# Patient Record
Sex: Female | Born: 1995 | Race: Black or African American | Hispanic: No | Marital: Single | Smoking: Former smoker
Health system: Southern US, Community
[De-identification: ages and names within clinical notes are randomized; demographics above are authoritative.]

## PROBLEM LIST (undated history)

## (undated) ENCOUNTER — Inpatient Hospital Stay (HOSPITAL_COMMUNITY): Payer: Self-pay

## (undated) DIAGNOSIS — B999 Unspecified infectious disease: Secondary | ICD-10-CM

## (undated) DIAGNOSIS — J45909 Unspecified asthma, uncomplicated: Secondary | ICD-10-CM

## (undated) DIAGNOSIS — I10 Essential (primary) hypertension: Secondary | ICD-10-CM

## (undated) DIAGNOSIS — F419 Anxiety disorder, unspecified: Secondary | ICD-10-CM

## (undated) HISTORY — DX: Essential (primary) hypertension: I10

## (undated) HISTORY — PX: NO PAST SURGERIES: SHX2092

---

## 2016-12-07 LAB — OB RESULTS CONSOLE RPR: RPR: NONREACTIVE

## 2016-12-07 LAB — OB RESULTS CONSOLE GC/CHLAMYDIA
CHLAMYDIA, DNA PROBE: NEGATIVE
Gonorrhea: NEGATIVE

## 2016-12-07 LAB — CULTURE, OB URINE: Urine Culture, OB: <10000

## 2016-12-07 LAB — CYTOLOGY - PAP: PAP SMEAR: NEGATIVE

## 2016-12-07 LAB — HIV ANTIBODY (ROUTINE TESTING W REFLEX): HIV Screen 4th Generation wRfx: NONREACTIVE

## 2016-12-07 LAB — OB RESULTS CONSOLE HGB/HCT, BLOOD
HEMATOCRIT: 38
HEMOGLOBIN: 12.6

## 2016-12-07 LAB — OB RESULTS CONSOLE PLATELET COUNT: Platelets: 265

## 2016-12-07 LAB — OB RESULTS CONSOLE RUBELLA ANTIBODY, IGM: RUBELLA: IMMUNE

## 2016-12-07 LAB — OB RESULTS CONSOLE HEPATITIS B SURFACE ANTIGEN: Hepatitis B Surface Ag: NEGATIVE

## 2017-01-15 ENCOUNTER — Emergency Department (HOSPITAL_COMMUNITY)
Admission: EM | Admit: 2017-01-15 | Discharge: 2017-01-15 | Disposition: A | Discharge status: Home / Self Care | Payer: Medicaid Other | Attending: Emergency Medicine | Admitting: Emergency Medicine

## 2017-01-15 ENCOUNTER — Encounter (HOSPITAL_COMMUNITY): Payer: Self-pay | Admitting: Emergency Medicine

## 2017-01-15 DIAGNOSIS — J45909 Unspecified asthma, uncomplicated: Secondary | ICD-10-CM | POA: Insufficient documentation

## 2017-01-15 DIAGNOSIS — Z5321 Procedure and treatment not carried out due to patient leaving prior to being seen by health care provider: Secondary | ICD-10-CM | POA: Insufficient documentation

## 2017-01-15 HISTORY — DX: Unspecified asthma, uncomplicated: J45.909

## 2017-01-15 MED ORDER — ALBUTEROL SULFATE (2.5 MG/3ML) 0.083% IN NEBU
5.0000 mg | INHALATION_SOLUTION | Freq: Once | RESPIRATORY_TRACT | Status: AC
  Administered 2017-01-15: 5 mg via RESPIRATORY_TRACT
  Filled 2017-01-15: qty 6

## 2017-01-15 NOTE — ED Notes (Signed)
Called [facility] and made them aware of patient.

## 2017-01-15 NOTE — ED Triage Notes (Signed)
Pt comes in with complaints of an asthma flare up after being around someone smoking a cigarette this evening.  Pt reports moving here from [place] and does not have a primary care doctor or an inhaler.  Pt is 28 weeks and 4 days pregnant. Audible wheezing noted.  FHR 145.

## 2017-01-28 ENCOUNTER — Other Ambulatory Visit: Payer: Self-pay

## 2017-01-28 ENCOUNTER — Encounter (HOSPITAL_COMMUNITY): Payer: Self-pay | Admitting: *Deleted

## 2017-01-28 ENCOUNTER — Inpatient Hospital Stay (HOSPITAL_COMMUNITY)
Admission: AD | Admit: 2017-01-28 | Discharge: 2017-01-28 | Disposition: A | Discharge status: Home / Self Care | Payer: Medicaid Other | Source: Ambulatory Visit | Attending: Obstetrics and Gynecology | Admitting: Obstetrics and Gynecology

## 2017-01-28 DIAGNOSIS — O0933 Supervision of pregnancy with insufficient antenatal care, third trimester: Secondary | ICD-10-CM | POA: Diagnosis not present

## 2017-01-28 DIAGNOSIS — O99343 Other mental disorders complicating pregnancy, third trimester: Secondary | ICD-10-CM | POA: Insufficient documentation

## 2017-01-28 DIAGNOSIS — O99513 Diseases of the respiratory system complicating pregnancy, third trimester: Secondary | ICD-10-CM

## 2017-01-28 DIAGNOSIS — F419 Anxiety disorder, unspecified: Secondary | ICD-10-CM | POA: Insufficient documentation

## 2017-01-28 DIAGNOSIS — Z87891 Personal history of nicotine dependence: Secondary | ICD-10-CM | POA: Insufficient documentation

## 2017-01-28 DIAGNOSIS — R0602 Shortness of breath: Secondary | ICD-10-CM | POA: Diagnosis present

## 2017-01-28 DIAGNOSIS — J4521 Mild intermittent asthma with (acute) exacerbation: Secondary | ICD-10-CM | POA: Diagnosis not present

## 2017-01-28 DIAGNOSIS — Z3A29 29 weeks gestation of pregnancy: Secondary | ICD-10-CM | POA: Insufficient documentation

## 2017-01-28 DIAGNOSIS — R062 Wheezing: Secondary | ICD-10-CM | POA: Diagnosis present

## 2017-01-28 HISTORY — DX: Anxiety disorder, unspecified: F41.9

## 2017-01-28 HISTORY — DX: Unspecified infectious disease: B99.9

## 2017-01-28 MED ORDER — ALBUTEROL SULFATE HFA 108 (90 BASE) MCG/ACT IN AERS
2.0000 | INHALATION_SPRAY | Freq: Once | RESPIRATORY_TRACT | Status: AC
  Administered 2017-01-28: 2 via RESPIRATORY_TRACT
  Filled 2017-01-28: qty 6.7

## 2017-01-28 MED ORDER — PREDNISONE 20 MG PO TABS: 40.0000 mg | ORAL_TABLET | Freq: Two times a day (BID) | ORAL | 0 refills | Status: DC

## 2017-01-28 MED ORDER — IPRATROPIUM-ALBUTEROL 0.5-2.5 (3) MG/3ML IN SOLN
RESPIRATORY_TRACT | Status: AC
  Administered 2017-01-28: 3 mL
  Filled 2017-01-28: qty 3

## 2017-01-28 MED ORDER — ALBUTEROL SULFATE (2.5 MG/3ML) 0.083% IN NEBU: 2.5000 mg | INHALATION_SOLUTION | Freq: Once | RESPIRATORY_TRACT | Status: DC

## 2017-01-28 MED ORDER — ALBUTEROL SULFATE HFA 108 (90 BASE) MCG/ACT IN AERS: 2.0000 | INHALATION_SPRAY | Freq: Four times a day (QID) | RESPIRATORY_TRACT | 2 refills | Status: DC | PRN

## 2017-01-28 MED ORDER — PREDNISONE 20 MG PO TABS: 20.0000 mg | ORAL_TABLET | Freq: Two times a day (BID) | ORAL | 0 refills | Status: AC

## 2017-01-28 MED ORDER — IPRATROPIUM-ALBUTEROL 0.5-2.5 (3) MG/3ML IN SOLN: 3.0000 mL | Freq: Once | RESPIRATORY_TRACT | Status: AC

## 2017-01-28 NOTE — MAU Note (Signed)
Since this morning, has been wheezing,  Has been short of breath. Dx with asthma at age 21.  Does not have a rescue inhaler. Cough started during the night,non- productive. Denies sore throat or fever.  [place]

## 2017-01-28 NOTE — MAU Note (Signed)
Urine sent to lab 

## 2017-01-28 NOTE — MAU Provider Note (Signed)
Chief Complaint:  Shortness of Breath and Wheezing   None     HPI: Tonya Joseph is a 21 y.o. G1P0 at 6379w4d with limited prenatal care and hx of asthma who presents to maternity admissions reporting shortness of breath, wheezing, and cough starting today. She usually has an albuterol inhaler but she ran out recently. She denies any recent respiratory infection or other known trigger for her asthma but reports symptoms started today and worsens over the course of the day. She has a cough and wheezing associated with her shortness of breath. There is also sharp low back pain, especially when she coughs.  She has not tried any treatments.  There are no other associated symptoms.  [place] in [place] but moved to [place] 1 [place] ago and has not had any prenatal visits.   She reports good fetal movement, denies cramping/contractions, LOF, vaginal bleeding, vaginal itching/burning, urinary symptoms, h/a, dizziness, n/v, or fever/chills.    HPI  Past Medical History: Past Medical History:  Diagnosis Date  . Anxiety   . Asthma   . Infection    UTI    Past obstetric history: OB History  Gravida Para Term Preterm AB Living  2 1 1    0 1  SAB TAB Ectopic Multiple Live Births          1    # Outcome Date GA Lbr Len/2nd Weight Sex Delivery Anes PTL Lv  2 Current           1 Term     F Vag-Spont  N LIV      Past Surgical History: Past Surgical History:  Procedure Laterality Date  . NO PAST SURGERIES      Family History: Family History  Problem Relation Age of Onset  . Diabetes Maternal Grandmother   . Hypertension Paternal Grandmother     Social History: Social History   Tobacco Use  . Smoking status: Former Games developermoker  . Smokeless tobacco: Never Used  . Tobacco comment: quit afteer asthma dx  Substance Use Topics  . Alcohol use: No    Frequency: Never  . Drug use: No    Allergies: No Known Allergies  Meds:  No medications prior to admission.    ROS:   Review of Systems  Constitutional: Negative for chills, fatigue and fever.  Eyes: Negative for visual disturbance.  Respiratory: Positive for cough, chest tightness, shortness of breath and wheezing.   Cardiovascular: Negative for chest pain.  Gastrointestinal: Negative for abdominal pain, nausea and vomiting.  Genitourinary: Negative for difficulty urinating, dysuria, flank pain, pelvic pain, vaginal bleeding, vaginal discharge and vaginal pain.  Musculoskeletal: Positive for back pain.  Neurological: Negative for dizziness and headaches.  Psychiatric/Behavioral: Negative.      I have reviewed patient's Past Medical Hx, Surgical Hx, Family Hx, Social Hx, medications and allergies.   Physical Exam   Patient Vitals for the past 24 hrs:  BP Pulse Resp SpO2  01/28/17 1651 (!) 117/51 91 16 98 %  01/28/17 1514 122/66 77 18 100 %  01/28/17 1405 112/71 89 18 96 %  01/28/17 1330 - - - 100 %  01/28/17 1310 - - - 100 %   Constitutional: Well-developed, well-nourished female in moderate distress.  HEART: normal rate, heart sounds, regular rhythm RESP: labored breathing, inspiratory and expiratory wheezing bilaterally in all lobes GI: Abd soft, non-tender, gravid appropriate for gestational age.  MS: Extremities nontender, no edema, normal ROM Neurologic: Alert and oriented x 4.  GU: Neg CVAT.      FHT:  Baseline 145 , moderate variability, accelerations present, no decelerations Contractions:None on toco or to palpation   Labs: No results found for this or any previous visit (from the past 24 hour(s)).    Imaging:  No results found.  MAU Course/MDM: I have ordered labs and reviewed results.  NST reviewed and reactive Pt with wheezing throughout and cough, classic presentation of asthma exacerbation.  O2 sats 100% on RA Duoneb nebulizer breathing treatment given by respiratory staff Pt with significant improvement and wheezing resolved but cough persists so needs  corticosteroid therapy Pt without insurance so cannot afford inhaled steroids Consult Dr Emelda FearFerguson with presentation, exam findings and test results.  With severity of presentation and pt cost concerns, course of PO steroids x 5 days, renewed Rx for albuterol inhaler  [facility] to [facility] Pt discharge with strict return precautions.   Assessment: 1. Limited prenatal care in third trimester   2. Mild intermittent asthma with exacerbation     Plan: Discharge home Labor precautions and fetal kick counts  Follow-up Information    [facility]   Specialty:  Obstetrics and Gynecology Why:  The office will call you for appointment. Return to MAU as needed for emergencies.  Contact information: [address] [place] [id number] [phone]         Allergies as of 01/28/2017   No Known Allergies     Medication List    TAKE these medications   albuterol 108 (90 Base) MCG/ACT inhaler Commonly known as:  PROVENTIL HFA;VENTOLIN HFA Inhale 2 puffs every 6 (six) hours as needed into the lungs for wheezing or shortness of breath. What changed:  how much to take   predniSONE 20 MG tablet Commonly known as:  DELTASONE Take 1 tablet (20 mg total) 2 (two) times daily with a meal for 5 days by mouth.   prenatal multivitamin Tabs tablet Take 1 tablet daily at 12 noon by mouth.       Sharen CounterLisa Leftwich-Kirby Certified Nurse-Midwife 01/28/2017 8:47 PM

## 2017-02-04 ENCOUNTER — Encounter (HOSPITAL_COMMUNITY): Payer: Self-pay | Admitting: *Deleted

## 2017-02-04 ENCOUNTER — Inpatient Hospital Stay (HOSPITAL_COMMUNITY)
Admission: AD | Admit: 2017-02-04 | Discharge: 2017-02-04 | Disposition: A | Discharge status: Home / Self Care | Payer: Medicaid Other | Source: Ambulatory Visit | Attending: Obstetrics and Gynecology | Admitting: Obstetrics and Gynecology

## 2017-02-04 DIAGNOSIS — O9989 Other specified diseases and conditions complicating pregnancy, childbirth and the puerperium: Secondary | ICD-10-CM | POA: Diagnosis not present

## 2017-02-04 DIAGNOSIS — R109 Unspecified abdominal pain: Secondary | ICD-10-CM | POA: Insufficient documentation

## 2017-02-04 DIAGNOSIS — O26893 Other specified pregnancy related conditions, third trimester: Secondary | ICD-10-CM | POA: Insufficient documentation

## 2017-02-04 DIAGNOSIS — Z3689 Encounter for other specified antenatal screening: Secondary | ICD-10-CM

## 2017-02-04 DIAGNOSIS — W19XXXA Unspecified fall, initial encounter: Secondary | ICD-10-CM

## 2017-02-04 DIAGNOSIS — M545 Low back pain: Secondary | ICD-10-CM

## 2017-02-04 DIAGNOSIS — Z3A30 30 weeks gestation of pregnancy: Secondary | ICD-10-CM | POA: Insufficient documentation

## 2017-02-04 LAB — URINALYSIS, ROUTINE W REFLEX MICROSCOPIC
Bilirubin Urine: NEGATIVE
Glucose, UA: 50 mg/dL — AB
Hgb urine dipstick: NEGATIVE
KETONES UR: NEGATIVE mg/dL
LEUKOCYTES UA: NEGATIVE
NITRITE: NEGATIVE
PROTEIN: 30 mg/dL — AB
Specific Gravity, Urine: 1.024 (ref 1.005–1.030)
pH: 6 (ref 5.0–8.0)

## 2017-02-04 MED ORDER — CYCLOBENZAPRINE HCL 10 MG PO TABS: 10.0000 mg | ORAL_TABLET | Freq: Three times a day (TID) | ORAL | 0 refills | Status: DC | PRN

## 2017-02-04 MED ORDER — ACETAMINOPHEN 500 MG PO TABS
1000.0000 mg | ORAL_TABLET | Freq: Once | ORAL | Status: AC
  Administered 2017-02-04: 1000 mg via ORAL
  Filled 2017-02-04: qty 2

## 2017-02-04 MED ORDER — CYCLOBENZAPRINE HCL 10 MG PO TABS
10.0000 mg | ORAL_TABLET | Freq: Once | ORAL | Status: AC
  Administered 2017-02-04: 10 mg via ORAL
  Filled 2017-02-04: qty 1

## 2017-02-04 NOTE — MAU Note (Signed)
Pt reports she awakened this am with really bad heartburn and vomited. States when she vomited she felt something pull in her back and also fell into the tub and is now having constant mid/lower back pain and abd pain.

## 2017-02-04 NOTE — MAU Provider Note (Signed)
Chief Complaint:  Back Pain; Abdominal Pain; and Fall   First Provider Initiated Contact with Patient 02/04/17 1829      HPI: Tonya Joseph is a 21 y.o. G2P1001 at 6934w4d who presents to maternity admissions reporting back pain, abdominal pain and fall. She reports that she fell in the shower on her back over an hour ago- states she did not hit her stomach at any point. Reports that she felt something pull in her back when she was vomiting this morning that pulled a muscle in her back. Abdominal pain began after she fell and has been intermitting sharp pain on her ride side where she fell. She has not taken any medication for pain relief. She reports good fetal movement, denies LOF, vaginal bleeding, vaginal itching/burning, urinary symptoms, h/a, dizziness, n/v, or fever/chills.    Past Medical History: Past Medical History:  Diagnosis Date  . Anxiety   . Asthma   . Infection    UTI    Past obstetric history: OB History  Gravida Para Term Preterm AB Living  2 1 1    0 1  SAB TAB Ectopic Multiple Live Births          1    # Outcome Date GA Lbr Len/2nd Weight Sex Delivery Anes PTL Lv  2 Current           1 Term     F Vag-Spont  N LIV      Past Surgical History: Past Surgical History:  Procedure Laterality Date  . NO PAST SURGERIES      Family History: Family History  Problem Relation Age of Onset  . Diabetes Maternal Grandmother   . Hypertension Paternal Grandmother     Social History: Social History   Tobacco Use  . Smoking status: Former Games developermoker  . Smokeless tobacco: Never Used  . Tobacco comment: quit afteer asthma dx  Substance Use Topics  . Alcohol use: No    Frequency: Never  . Drug use: No    Allergies: No Known Allergies  Meds:  No medications prior to admission.    ROS:   Review of Systems  Constitutional: Negative for chills, diaphoresis, fatigue and fever.  Respiratory: Negative for cough, chest tightness, shortness of breath and wheezing.    Cardiovascular: Negative for chest pain and leg swelling.  Gastrointestinal: Positive for abdominal pain. Negative for constipation, diarrhea, nausea and vomiting.  Genitourinary: Negative for difficulty urinating, dysuria, flank pain, urgency, vaginal bleeding, vaginal discharge and vaginal pain.  Musculoskeletal: Positive for back pain. Negative for neck pain.  Neurological: Negative for dizziness, weakness, light-headedness and headaches.  All other systems reviewed and are negative.  I have reviewed patient's Past Medical Hx, Surgical Hx, Family Hx, Social Hx, medications and allergies.   Physical Exam   Patient Vitals for the past 24 hrs:  BP Temp Temp src Pulse Resp SpO2 Height Weight  02/04/17 2233 117/63 98.1 F (36.7 C) Oral 91 18 - - -  02/04/17 1807 (!) 101/55 98.6 F (37 C) Oral 88 20 99 % 5\' 6"  (1.676 m) 207 lb (93.9 kg)   Constitutional: Well-developed, well-nourished female in no acute distress.  Cardiovascular: normal rate Respiratory: normal effort GI: Abd soft, non-tender, gravid appropriate for gestational age.  MS: Extremities nontender, no edema, normal ROM Neurologic: Alert and oriented x 4.  GU: Neg CVAT.  Cervical exam: Dilation: Closed Effacement (%): Thick Station: Ballotable Exam by:: Fabian NovemberM Oluwasemilore Pascuzzi CNM  Dilation: Closed Effacement (%): Thick Station: Ballotable Exam by::  Fabian NovemberM Katena Petitjean CNM FHT:  Baseline 145, moderate variability, accelerations present, no decelerations Contractions: irregular irritability   Labs: Results for orders placed or performed during the hospital encounter of 02/04/17 (from the past 24 hour(s))  Urinalysis, Routine w reflex microscopic     Status: Abnormal   Collection Time: 02/04/17  6:08 PM  Result Value Ref Range   Color, Urine YELLOW YELLOW   APPearance HAZY (A) CLEAR   Specific Gravity, Urine 1.024 1.005 - 1.030   pH 6.0 5.0 - 8.0   Glucose, UA 50 (A) NEGATIVE mg/dL   Hgb urine dipstick NEGATIVE NEGATIVE   Bilirubin  Urine NEGATIVE NEGATIVE   Ketones, ur NEGATIVE NEGATIVE mg/dL   Protein, ur 30 (A) NEGATIVE mg/dL   Nitrite NEGATIVE NEGATIVE   Leukocytes, UA NEGATIVE NEGATIVE   RBC / HPF 0-5 0 - 5 RBC/hpf   WBC, UA 0-5 0 - 5 WBC/hpf   Bacteria, UA RARE (A) NONE SEEN   Squamous Epithelial / LPF 6-30 (A) NONE SEEN   Mucus PRESENT       MAU Course/MDM: I have ordered labs and reviewed results for Urinalysis  NST reviewed   Treatments in MAU included Flexeril 10mg  for muscle spasm. 1,000mg  Tylenol for abdominal pain. 4 hours of monitoring for fall.    Tonya Joseph Certified Nurse-Midwife 02/04/2017   2005: [facility] [place] [place] Prolonged EFM, Flexeril, heating pad  Abdominal pain resolved, back pain slightly improved. No evidence of abruption or PTL- cervix recheck closed/thick Stable for discharge home  Assessment: 1. [redacted] weeks gestation of pregnancy   2. NST (non-stress test) reactive   3. Fall, initial encounter    Plan: Discharge home Follow up in OB office this week as scheduled PTL/abruption precautions Rx Flexeril Tylenol prn Heat-tub soaks  Allergies as of 02/04/2017   No Known Allergies     Medication List    TAKE these medications   albuterol 108 (90 Base) MCG/ACT inhaler Commonly known as:  PROVENTIL HFA;VENTOLIN HFA Inhale 2 puffs every 6 (six) hours as needed into the lungs for wheezing or shortness of breath.   cyclobenzaprine 10 MG tablet Commonly known as:  FLEXERIL Take 1 tablet (10 mg total) by mouth every 8 (eight) hours as needed for muscle spasms.   prenatal multivitamin Tabs tablet Take 1 tablet daily at 12 noon by mouth.       Donette LarryBhambri, Malika Demario, CNM  02/05/2017 12:12 AM

## 2017-02-04 NOTE — Discharge Instructions (Signed)
Back Pain in Pregnancy Back pain during pregnancy is common. Back pain may be caused by several factors that are related to changes during your pregnancy. Follow these instructions at home: Managing pain, stiffness, and swelling  If directed, apply ice for sudden (acute) back pain. ? Put ice in a plastic bag. ? Place a towel between your skin and the bag. ? Leave the ice on for 20 minutes, 2-3 times per day.  If directed, apply heat to the affected area before you exercise: ? Place a towel between your skin and the heat pack or heating pad. ? Leave the heat on for 20-30 minutes. ? Remove the heat if your skin turns bright red. This is especially important if you are unable to feel pain, heat, or cold. You may have a greater risk of getting burned. Activity  Exercise as told by your health care provider. Exercising is the best way to prevent or manage back pain.  Listen to your body when lifting. If lifting hurts, ask for help or bend your knees. This uses your leg muscles instead of your back muscles.  Squat down when picking up something from the floor. Do not bend over.  Only use bed rest as told by your health care provider. Bed rest should only be used for the most severe episodes of back pain. Standing, Sitting, and Lying Down  Do not stand in one place for long periods of time.  Use good posture when sitting. Make sure your head rests over your shoulders and is not hanging forward. Use a pillow on your lower back if necessary.  Try sleeping on your side, preferably the left side, with a pillow or two between your legs. If you are sore after a night's rest, your bed may be too soft. A firm mattress may provide more support for your back during pregnancy. General instructions  Do not wear high heels.  Eat a healthy diet. Try to gain weight within your health care provider's recommendations.  Use a maternity girdle, elastic sling, or back brace as told by your health care  provider.  Take over-the-counter and prescription medicines only as told by your health care provider.  Keep all follow-up visits as told by your health care provider. This is important. This includes any visits with any specialists, such as a physical therapist. Contact a health care provider if:  Your back pain interferes with your daily activities.  You have increasing pain in other parts of your body. Get help right away if:  You develop numbness, tingling, weakness, or problems with the use of your arms or legs.  You develop severe back pain that is not controlled with medicine.  You have a sudden change in bowel or bladder control.  You develop shortness of breath, dizziness, or you faint.  You develop nausea, vomiting, or sweating.  You have back pain that is a rhythmic, cramping pain similar to labor pains. Labor pain is usually 1-2 minutes apart, lasts for about 1 minute, and involves a bearing down feeling or pressure in your pelvis.  You have back pain and your water breaks or you have vaginal bleeding.  You have back pain or numbness that travels down your leg.  Your back pain developed after you fell.  You develop pain on one side of your back.  You see blood in your urine.  You develop skin blisters in the area of your back pain. This information is not intended to replace advice given to you   by your health care provider. Make sure you discuss any questions you have with your health care provider. Document Released: 06/07/2005 Document Revised: 08/05/2015 Document Reviewed: 11/11/2014 Elsevier Interactive Patient Education  2018 [employer]xton Hicks Contractions Contractions of the uterus can occur throughout pregnancy, but they are not always a sign that you are in labor. You may have practice contractions called Braxton Hicks contractions. These false labor contractions are sometimes confused with true labor. What are Deberah PeltonBraxton Hicks  contractions? Braxton Hicks contractions are tightening movements that occur in the muscles of the uterus before labor. Unlike true labor contractions, these contractions do not result in opening (dilation) and thinning of the cervix. Toward the end of pregnancy (32-34 weeks), Braxton Hicks contractions can happen more often and may become stronger. These contractions are sometimes difficult to tell apart from true labor because they can be very uncomfortable. You should not feel embarrassed if you go to the hospital with false labor. Sometimes, the only way to tell if you are in true labor is for your health care provider to look for changes in the cervix. The health care provider will do a physical exam and may monitor your contractions. If you are not in true labor, the exam should show that your cervix is not dilating and your water has not broken. If there are no prenatal problems or other health problems associated with your pregnancy, it is completely safe for you to be sent home with false labor. You may continue to have Braxton Hicks contractions until you go into true labor. How can I tell the difference between true labor and false labor?  Differences ? False labor ? Contractions last 30-70 seconds.: Contractions are usually shorter and not as strong as true labor contractions. ? Contractions become very regular.: Contractions are usually irregular. ? Discomfort is usually felt in the top of the uterus, and it spreads to the lower abdomen and low back.: Contractions are often felt in the front of the lower abdomen and in the groin. ? Contractions do not go away with walking.: Contractions may go away when you walk around or change positions while lying down. ? Contractions usually become more intense and increase in frequency.: Contractions get weaker and are shorter-lasting as time goes on. ? The cervix dilates and gets thinner.: The cervix usually does not dilate or become thin. Follow  these instructions at home:  Take over-the-counter and prescription medicines only as told by your health care provider.  Keep up with your usual exercises and follow other instructions from your health care provider.  Eat and drink lightly if you think you are going into labor.  If Braxton Hicks contractions are making you uncomfortable: ? Change your position from lying down or resting to walking, or change from walking to resting. ? Sit and rest in a tub of warm water. ? Drink enough fluid to keep your urine clear or pale yellow. Dehydration may cause these contractions. ? Do slow and deep breathing several times an hour.  Keep all follow-up prenatal visits as told by your health care provider. This is important. Contact a health care provider if:  You have a fever.  You have continuous pain in your abdomen. Get help right away if:  Your contractions become stronger, more regular, and closer together.  You have fluid leaking or gushing from your vagina.  You pass blood-tinged mucus (bloody show).  You have bleeding from your vagina.  You have low back pain that you never  had before.  You feel your babys head pushing down and causing pelvic pressure.  Your baby is not moving inside you as much as it used to. Summary  Contractions that occur before labor are called Braxton Hicks contractions, false labor, or practice contractions.  Braxton Hicks contractions are usually shorter, weaker, farther apart, and less regular than true labor contractions. True labor contractions usually become progressively stronger and regular and they become more frequent.  Manage discomfort from [facility] contractions by changing position, resting in a warm bath, drinking plenty of water, or practicing deep breathing. This information is not intended to replace advice given to you by your health care provider. Make sure you discuss any questions you have with your health care  provider. Document Released: 02/27/2005 Document Revised: 01/17/2016 Document Reviewed: 01/17/2016 Elsevier Interactive Patient Education  2017 Elsevier Inc. Fetal Movement Counts Patient Name: ________________________________________________ Patient Due Date: ____________________ What is a fetal movement count? A fetal movement count is the number of times that you feel your baby move during a certain amount of time. This may also be called a fetal kick count. A fetal movement count is recommended for every pregnant woman. You may be asked to start counting fetal movements as early as week 28 of your pregnancy. Pay attention to when your baby is most active. You may notice your baby's sleep and wake cycles. You may also notice things that make your baby move more. You should do a fetal movement count:  When your baby is normally most active.  At the same time each day.  A good time to count movements is while you are resting, after having something to eat and drink. How do I count fetal movements? 1. Find a quiet, comfortable area. Sit, or lie down on your side. 2. Write down the date, the start time and stop time, and the number of movements that you felt between those two times. Take this information with you to your health care visits. 3. For 2 hours, count kicks, flutters, swishes, rolls, and jabs. You should feel at least 10 movements during 2 hours. 4. You may stop counting after you have felt 10 movements. 5. If you do not feel 10 movements in 2 hours, have something to eat and drink. Then, keep resting and counting for 1 hour. If you feel at least 4 movements during that hour, you may stop counting. Contact a health care provider if:  You feel fewer than 4 movements in 2 hours.  Your baby is not moving like he or she usually does. Date: ____________ Start time: ____________ Stop time: ____________ Movements: ____________ Date: ____________ Start time: ____________ Stop time:  ____________ Movements: ____________ Date: ____________ Start time: ____________ Stop time: ____________ Movements: ____________ Date: ____________ Start time: ____________ Stop time: ____________ Movements: ____________ Date: ____________ Start time: ____________ Stop time: ____________ Movements: ____________ Date: ____________ Start time: ____________ Stop time: ____________ Movements: ____________ Date: ____________ Start time: ____________ Stop time: ____________ Movements: ____________ Date: ____________ Start time: ____________ Stop time: ____________ Movements: ____________ Date: ____________ Start time: ____________ Stop time: ____________ Movements: ____________ This information is not intended to replace advice given to you by your health care provider. Make sure you discuss any questions you have with your health care provider. Document Released: 03/29/2006 Document Revised: 10/27/2015 Document Reviewed: 04/08/2015 Elsevier Interactive Patient Education  [employer]2018 Elsevier Inc.

## 2017-02-06 ENCOUNTER — Other Ambulatory Visit: Payer: Self-pay

## 2017-02-06 ENCOUNTER — Encounter: Payer: Self-pay | Admitting: General Practice

## 2017-02-06 ENCOUNTER — Ambulatory Visit (INDEPENDENT_AMBULATORY_CARE_PROVIDER_SITE_OTHER): Payer: Medicaid Other

## 2017-02-06 VITALS — BP 124/68 | HR 84 | Wt 205.5 lb

## 2017-02-06 DIAGNOSIS — O09219 Supervision of pregnancy with history of pre-term labor, unspecified trimester: Secondary | ICD-10-CM

## 2017-02-06 DIAGNOSIS — Z3480 Encounter for supervision of other normal pregnancy, unspecified trimester: Secondary | ICD-10-CM

## 2017-02-06 DIAGNOSIS — O09213 Supervision of pregnancy with history of pre-term labor, third trimester: Secondary | ICD-10-CM | POA: Diagnosis not present

## 2017-02-06 DIAGNOSIS — Z113 Encounter for screening for infections with a predominantly sexual mode of transmission: Secondary | ICD-10-CM

## 2017-02-06 DIAGNOSIS — O09899 Supervision of other high risk pregnancies, unspecified trimester: Secondary | ICD-10-CM

## 2017-02-06 DIAGNOSIS — Z23 Encounter for immunization: Secondary | ICD-10-CM

## 2017-02-06 DIAGNOSIS — O360930 Maternal care for other rhesus isoimmunization, third trimester, not applicable or unspecified: Secondary | ICD-10-CM

## 2017-02-06 DIAGNOSIS — O0933 Supervision of pregnancy with insufficient antenatal care, third trimester: Secondary | ICD-10-CM

## 2017-02-06 DIAGNOSIS — O0930 Supervision of pregnancy with insufficient antenatal care, unspecified trimester: Secondary | ICD-10-CM | POA: Insufficient documentation

## 2017-02-06 LAB — POCT URINALYSIS DIP (DEVICE)
Bilirubin Urine: NEGATIVE
Glucose, UA: NEGATIVE mg/dL
Hgb urine dipstick: NEGATIVE
Ketones, ur: NEGATIVE mg/dL
Leukocytes, UA: NEGATIVE
Nitrite: NEGATIVE
Protein, ur: NEGATIVE mg/dL
Specific Gravity, Urine: 1.02 (ref 1.005–1.030)
Urobilinogen, UA: 0.2 mg/dL (ref 0.0–1.0)
pH: 6.5 (ref 5.0–8.0)

## 2017-02-06 MED ORDER — RHO D IMMUNE GLOBULIN 1500 UNIT/2ML IJ SOSY
300.0000 ug | PREFILLED_SYRINGE | Freq: Once | INTRAMUSCULAR | Status: AC
  Administered 2017-02-06: 300 ug via INTRAMUSCULAR

## 2017-02-06 NOTE — Patient Instructions (Signed)
Safe Medications in Pregnancy   Acne: Benzoyl Peroxide Salicylic Acid  Backache/Headache: Tylenol: 2 regular strength every 4 hours OR              2 Extra strength every 6 hours  Colds/Coughs/Allergies: Benadryl (alcohol free) 25 mg every 6 hours as needed Breath right strips Claritin Cepacol throat lozenges Chloraseptic throat spray Cold-Eeze- up to three times per day Cough drops, alcohol free Flonase (by prescription only) Guaifenesin Mucinex Robitussin DM (plain only, alcohol free) Saline nasal spray/drops Sudafed (pseudoephedrine) & Actifed ** use only after [redacted] weeks gestation and if you do not have high blood pressure Tylenol Vicks Vaporub Zinc lozenges Zyrtec   Constipation: Colace Ducolax suppositories Fleet enema Glycerin suppositories Metamucil Milk of magnesia Miralax Senokot Smooth move tea  Diarrhea: Kaopectate Imodium A-D  *NO pepto Bismol  Hemorrhoids: Anusol Anusol HC Preparation H Tucks  Indigestion: Tums Maalox Mylanta Zantac  Pepcid  Insomnia: Benadryl (alcohol free) 25mg every 6 hours as needed Tylenol PM Unisom, no Gelcaps  Leg Cramps: Tums MagGel  Nausea/Vomiting:  Bonine Dramamine Emetrol Ginger extract Sea bands Meclizine  Nausea medication to take during pregnancy:  Unisom (doxylamine succinate 25 mg tablets) Take one tablet daily at bedtime. If symptoms are not adequately controlled, the dose can be increased to a maximum recommended dose of two tablets daily (1/2 tablet in the morning, 1/2 tablet mid-afternoon and one at bedtime). Vitamin B6 100mg tablets. Take one tablet twice a day (up to 200 mg per day).  Skin Rashes: Aveeno products Benadryl cream or 25mg every 6 hours as needed Calamine Lotion 1% cortisone cream  Yeast infection: Gyne-lotrimin 7 Monistat 7   **If taking multiple medications, please check labels to avoid duplicating the same active ingredients **take medication as directed on  the label ** Do not exceed 4000 mg of tylenol in 24 hours **Do not take medications that contain aspirin or ibuprofen    AREA PEDIATRIC/FAMILY PRACTICE PHYSICIANS  [facility] [address] [place], [place]  [address] Phone - [phone]   Fax - [phone]  [facility] [address] [place], [place] [address] Phone - [phone]   Fax - [phone]  [facility] [address] [place], [place]  [address] Phone - [phone]   Fax - [phone]  [facility] [address] [place], [place]  [address] Phone - [phone]   Fax - [phone]  [facility] [address] [place], [place]  [address] Phone - [phone]   Fax - [phone]  [facility] [address] [place], [place]  [address] Phone - [phone]   Fax - [phone]  [facility] [address] [place], [place]  [address] Phone - [phone]   Fax - [phone]  [facility] [address] [place], [place]  [address] Phone - [phone]   Fax - [phone]  [facility] [address] [place], [place]  [address] Phone - [phone]   Fax - [phone] [facility] [address] [place], [place]  [address] Phone - [phone]   Fax - [phone]  [facility] [address] [place], [place]  [address] Phone - [phone]   Fax - [phone]  [facility] [address] , [place]  [address] Phone - [phone]   Fax -   [phone]  [facility] [address] [place], [place]  [address] Phone - [phone]   Fax - [phone]  [facility] [address], [address] [place], [place]  [address] Phone - [phone]  [facility] [address] [place], [place]  [address] Phone - [phone]   Fax - [phone]  [facility] [address] [place], [place]  [address] Phone - [phone]   Fax - [phone]  [facility] [address] [place], [place]  [address] Phone - [phone]   Fax - [phone]  [facility] [address] [place], [place]  [address] Phone - [phone]   Fax - [phone]   [facility] [address] [place], [place]  [address] Phone - [phone]   Fax - [phone]  [facility] [address] [place], [place]  [address] Phone - [phone]   Fax - [phone]  [facility]UB[facility] [address] [place], [place]  [address] Phone - [phone]   Fax - [phone]  [facility] [address] [place], [place]  [address] Phone - [phone]   Fax - [phone]  [facility] [address] [place], [place]  [address] Phone - [phone]   Fax - [phone] [facility] [address] [place], [place]  [address] Phone - [phone]   Fax - [phone]  [facility] [address] [place], [place]  [address] Phone - [phone]   Fax - [phone]  [facility] [place] [address] [place], [place]  [address] Phone - [phone]   Fax - [phone]  [facility] [place] Wyvonne Lenzharlene [place]ing MD [address] [place] [address] Phone [phone]  Fax [phone]  CIRCUMCISION  Circumcision is considered an elective/non-medically necessary procedure. There are many reasons parents decide to have their sons circumsized. During the first year of life circumcised males have a reduced risk of urinary tract infections but after this year the rates between circumcised males and uncircumcised males are the same.  It is safe to have your  son circumcised outside of the hospital and the places above perform them regularly.    Places to have your son circumcised:    [facility] [phone] $480 by 4 wks  [facility] [phone] $244 by 4 wks  [facility] [phone] $175 by 2 wks  [facility] [phone] $250 by 7 days [phone] $150 by 4 wks  These prices sometimes change but are roughly what you can expect to pay. Please call and confirm pricing.

## 2017-02-06 NOTE — Progress Notes (Signed)
Flu/tdap/rhogam vaccine today 1hr gtt with initial labs Schedule u/s

## 2017-02-06 NOTE — Progress Notes (Signed)
  Subjective:    Tonya Joseph is being seen today for her first obstetrical visit.  This is not a planned pregnancy. She is at 121w6d gestation. Her obstetrical history is significant for late prenatal care, preterm labor, preterm birth. Relationship with FOB: significant other, living together. Patient does intend to breast feed. Pregnancy history fully reviewed.  Patient reports no complaints.  Review of Systems:   Review of Systems  Constitutional: Negative.  Negative for fatigue and fever.  HENT: Negative.   Respiratory: Negative.  Negative for shortness of breath.   Cardiovascular: Negative.  Negative for chest pain.  Gastrointestinal: Negative.  Negative for abdominal pain, constipation, diarrhea, nausea and vomiting.  Genitourinary: Negative.  Negative for dysuria, vaginal bleeding and vaginal discharge.  Neurological: Negative.  Negative for dizziness and headaches.    Objective:     BP 124/68   Pulse 84   Wt 205 lb 8 oz (93.2 kg)   LMP 05/24/2016 (LMP Unknown)   BMI 33.17 kg/m  Physical Exam  Nursing note and vitals reviewed. Constitutional: She is oriented to person, place, and time. She appears well-developed and well-nourished. No distress.  HENT:  Head: Normocephalic.  Eyes: Pupils are equal, round, and reactive to light.  Cardiovascular: Normal rate, regular rhythm and normal heart sounds.  Respiratory: Effort normal and breath sounds normal. No respiratory distress.  GI: Soft. Bowel sounds are normal. She exhibits no distension. There is no tenderness.  Neurological: She is alert and oriented to person, place, and time.  Skin: Skin is warm and dry.  Psychiatric: She has a normal mood and affect. Her behavior is normal. Judgment and thought content normal.    Exam    Assessment:    Pregnancy: G2P0101 Patient Active Problem List   Diagnosis Date Noted  . Supervision of other normal pregnancy, antepartum 02/06/2017  . Hx of preterm delivery, currently  pregnant 02/06/2017  . Limited prenatal care 02/06/2017       Plan:     -Initial labs drawn. -Prenatal vitamins. -ROI signed and sent for records in [place] -Last pap in 2017 and normal per pt- records requested -Flu, Tdap today -Rhogam today- blood type B negative -1hr GTT- patient not fasting -Problem list reviewed and updated. -Role of ultrasound in pregnancy discussed; fetal survey: ordered. -Amniocentesis discussed: not indicated. -Preterm labor precautions reviewed -Follow up in 2 weeks.  The nature of [facility] with multiple MDs and other Advanced Practice Providers was explained to patient; also emphasized that residents, students are part of our team.  Rolm BookbinderCaroline M Neill CNM 02/06/2017 10:07 AM

## 2017-02-07 LAB — CERVICOVAGINAL ANCILLARY ONLY
CHLAMYDIA, DNA PROBE: NEGATIVE
NEISSERIA GONORRHEA: NEGATIVE

## 2017-02-08 ENCOUNTER — Ambulatory Visit (HOSPITAL_COMMUNITY)
Admission: RE | Admit: 2017-02-08 | Discharge: 2017-02-08 | Disposition: A | Discharge status: Home / Self Care | Payer: Self-pay | Source: Ambulatory Visit

## 2017-02-08 LAB — URINE CULTURE, OB REFLEX

## 2017-02-08 LAB — CULTURE, OB URINE

## 2017-02-13 LAB — OBSTETRIC PANEL, INCLUDING HIV
Antibody Screen: NEGATIVE
BASOS ABS: 0 10*3/uL (ref 0.0–0.2)
Basos: 0 %
EOS (ABSOLUTE): 0.3 10*3/uL (ref 0.0–0.4)
Eos: 3 %
HEP B S AG: NEGATIVE
HIV Screen 4th Generation wRfx: NONREACTIVE
Hematocrit: 38.5 % (ref 34.0–46.6)
Hemoglobin: 12.9 g/dL (ref 11.1–15.9)
IMMATURE GRANS (ABS): 0 10*3/uL (ref 0.0–0.1)
IMMATURE GRANULOCYTES: 0 %
LYMPHS: 20 %
Lymphocytes Absolute: 1.5 10*3/uL (ref 0.7–3.1)
MCH: 27.9 pg (ref 26.6–33.0)
MCHC: 33.5 g/dL (ref 31.5–35.7)
MCV: 83 fL (ref 79–97)
MONOCYTES: 7 %
Monocytes Absolute: 0.6 10*3/uL (ref 0.1–0.9)
Neutrophils Absolute: 5.5 10*3/uL (ref 1.4–7.0)
Neutrophils: 70 %
PLATELETS: 219 10*3/uL (ref 150–379)
RBC: 4.62 x10E6/uL (ref 3.77–5.28)
RDW: 12.8 % (ref 12.3–15.4)
RPR Ser Ql: NONREACTIVE
RUBELLA: 4.73 {index} (ref >0.99)
Rh Factor: NEGATIVE
WBC: 7.9 10*3/uL (ref 3.4–10.8)

## 2017-02-13 LAB — HEMOGLOBINOPATHY EVALUATION
FERRITIN: 15 ng/mL (ref 15–150)
HGB C: 0 %
HGB F QUANT: 0 % (ref 0.0–2.0)
HGB SOLUBILITY: NEGATIVE
Hgb A2 Quant: 1.9 % (ref 1.8–3.2)
Hgb A: 98.1 % (ref 96.4–98.8)
Hgb S: 0 %
Hgb Variant: 0 %

## 2017-02-13 LAB — CYSTIC FIBROSIS MUTATION 97: GENE DIS ANAL CARRIER INTERP BLD/T-IMP: NOT DETECTED

## 2017-02-13 LAB — GLUCOSE TOLERANCE, 1 HOUR: GLUCOSE, 1HR PP: 79 mg/dL (ref 65–199)

## 2017-02-21 DIAGNOSIS — Z6791 Unspecified blood type, Rh negative: Secondary | ICD-10-CM | POA: Insufficient documentation

## 2017-02-21 DIAGNOSIS — O26899 Other specified pregnancy related conditions, unspecified trimester: Secondary | ICD-10-CM

## 2017-02-22 ENCOUNTER — Encounter: Payer: Self-pay | Admitting: Student

## 2017-02-26 ENCOUNTER — Encounter: Payer: Self-pay | Admitting: Medical

## 2017-02-26 ENCOUNTER — Ambulatory Visit (INDEPENDENT_AMBULATORY_CARE_PROVIDER_SITE_OTHER): Payer: Self-pay | Admitting: Medical

## 2017-02-26 VITALS — BP 130/79 | HR 100 | Wt 208.0 lb

## 2017-02-26 DIAGNOSIS — Z3480 Encounter for supervision of other normal pregnancy, unspecified trimester: Secondary | ICD-10-CM

## 2017-02-26 DIAGNOSIS — O09899 Supervision of other high risk pregnancies, unspecified trimester: Secondary | ICD-10-CM

## 2017-02-26 DIAGNOSIS — O26899 Other specified pregnancy related conditions, unspecified trimester: Secondary | ICD-10-CM

## 2017-02-26 DIAGNOSIS — Z6791 Unspecified blood type, Rh negative: Secondary | ICD-10-CM

## 2017-02-26 DIAGNOSIS — H1031 Unspecified acute conjunctivitis, right eye: Secondary | ICD-10-CM

## 2017-02-26 DIAGNOSIS — O09219 Supervision of pregnancy with history of pre-term labor, unspecified trimester: Secondary | ICD-10-CM

## 2017-02-26 MED ORDER — BACITRACIN 500 UNIT/GM OP OINT: 1.0000 "application " | TOPICAL_OINTMENT | Freq: Three times a day (TID) | OPHTHALMIC | 0 refills | Status: DC

## 2017-02-26 NOTE — Progress Notes (Signed)
   PRENATAL VISIT NOTE  Subjective:  Tonya Joseph is a 21 y.o. G2P0101 at 6755w5d being seen today for ongoing prenatal care.  She is currently monitored for the following issues for this low-risk pregnancy and has Supervision of other normal pregnancy, antepartum; Hx of preterm delivery, currently pregnant; Limited prenatal care; and Rh negative state in antepartum period on their problem list.  Patient reports pelvic pressure and vaginal pain.  Contractions: Not present. Vag. Bleeding: None.  Movement: Present. Denies leaking of fluid.   The following portions of the patient's history were reviewed and updated as appropriate: allergies, current medications, past family history, past medical history, past social history, past surgical history and problem list. Problem list updated.  Objective:   Vitals:   02/26/17 1034  BP: 130/79  Pulse: 100  Weight: 208 lb (94.3 kg)    Fetal Status: Fetal Heart Rate (bpm): 133 Fundal Height: 33 cm Movement: Present  Presentation: Vertex  General:  Alert, oriented and cooperative. Patient is in no acute distress.  Skin: Skin is warm and dry. No rash noted.   Cardiovascular: Normal heart rate noted  Respiratory: Normal respiratory effort, no problems with respiration noted  Abdomen: Soft, gravid, appropriate for gestational age.  Pain/Pressure: Present     Pelvic: Cervical exam performed Dilation: Closed Effacement (%): 30 Station: -3  Extremities: Normal range of motion.  Edema: None  Mental Status:  Normal mood and affect. Normal behavior. Normal judgment and thought content.   Assessment and Plan:  Pregnancy: G2P0101 at 2955w5d  1. Supervision of other normal pregnancy, antepartum - Missed US appointment 2 weeks ago, rescheduled anatomy for 03/09/17  2. Hx of preterm delivery, currently pregnant - Patient now states that she delivered at 37 weeks  3. Rh negative state in antepartum period - Received Rhogam at 30 weeks   4.  Conjunctivitis - Rx for Bacitracin ophthalmic ointment sent to patient's pharmacy    Preterm labor symptoms and general obstetric precautions including but not limited to vaginal bleeding, contractions, leaking of fluid and fetal movement were reviewed in detail with the patient. Please refer to After Visit Summary for other counseling recommendations.  Return in about 2 weeks (around 03/12/2017) for LOB.   Vonzella NippleJulie Eliyah Mcshea, PA-C

## 2017-02-26 NOTE — Patient Instructions (Signed)
Fetal Movement Counts °Patient Name: ________________________________________________ Patient Due Date: ____________________ °What is a fetal movement count? °A fetal movement count is the number of times that you feel your baby move during a certain amount of time. This may also be called a fetal kick count. A fetal movement count is recommended for every pregnant woman. You may be asked to start counting fetal movements as early as week 28 of your pregnancy. °Pay attention to when your baby is most active. You may notice your baby's sleep and wake cycles. You may also notice things that make your baby move more. You should do a fetal movement count: °· When your baby is normally most active. °· At the same time each day. ° °A good time to count movements is while you are resting, after having something to eat and drink. °How do I count fetal movements? °1. Find a quiet, comfortable area. Sit, or lie down on your side. °2. Write down the date, the start time and stop time, and the number of movements that you felt between those two times. Take this information with you to your health care visits. °3. For 2 hours, count kicks, flutters, swishes, rolls, and jabs. You should feel at least 10 movements during 2 hours. °4. You may stop counting after you have felt 10 movements. °5. If you do not feel 10 movements in 2 hours, have something to eat and drink. Then, keep resting and counting for 1 hour. If you feel at least 4 movements during that hour, you may stop counting. °Contact a health care provider if: °· You feel fewer than 4 movements in 2 hours. °· Your baby is not moving like he or she usually does. °Date: ____________ Start time: ____________ Stop time: ____________ Movements: ____________ °Date: ____________ Start time: ____________ Stop time: ____________ Movements: ____________ °Date: ____________ Start time: ____________ Stop time: ____________ Movements: ____________ °Date: ____________ Start time:  ____________ Stop time: ____________ Movements: ____________ °Date: ____________ Start time: ____________ Stop time: ____________ Movements: ____________ °Date: ____________ Start time: ____________ Stop time: ____________ Movements: ____________ °Date: ____________ Start time: ____________ Stop time: ____________ Movements: ____________ °Date: ____________ Start time: ____________ Stop time: ____________ Movements: ____________ °Date: ____________ Start time: ____________ Stop time: ____________ Movements: ____________ °This information is not intended to replace advice given to you by your health care provider. Make sure you discuss any questions you have with your health care provider. °Document Released: 03/29/2006 Document Revised: 10/27/2015 Document Reviewed: 04/08/2015 °Elsevier Interactive Patient Education © 2018 [employer] °Braxton Hicks Contractions °Contractions of the uterus can occur throughout pregnancy, but they are not always a sign that you are in labor. You may have practice contractions called Braxton Hicks contractions. These false labor contractions are sometimes confused with true labor. °What are Braxton Hicks contractions? °Braxton Hicks contractions are tightening movements that occur in the muscles of the uterus before labor. Unlike true labor contractions, these contractions do not result in opening (dilation) and thinning of the cervix. Toward the end of pregnancy (32-34 weeks), Braxton Hicks contractions can happen more often and may become stronger. These contractions are sometimes difficult to tell apart from true labor because they can be very uncomfortable. You should not feel embarrassed if you go to the hospital with false labor. °Sometimes, the only way to tell if you are in true labor is for your health care provider to look for changes in the cervix. The health care provider will do a physical exam and may monitor your contractions. If   you are not in true labor, the exam  should show that your cervix is not dilating and your water has not broken. °If there are no prenatal problems or other health problems associated with your pregnancy, it is completely safe for you to be sent home with false labor. You may continue to have Braxton Hicks contractions until you go into true labor. °How can I tell the difference between true labor and false labor? °· Differences °? False labor °? Contractions last 30-70 seconds.: Contractions are usually shorter and not as strong as true labor contractions. °? Contractions become very regular.: Contractions are usually irregular. °? Discomfort is usually felt in the top of the uterus, and it spreads to the lower abdomen and low back.: Contractions are often felt in the front of the lower abdomen and in the groin. °? Contractions do not go away with walking.: Contractions may go away when you walk around or change positions while lying down. °? Contractions usually become more intense and increase in frequency.: Contractions get weaker and are shorter-lasting as time goes on. °? The cervix dilates and gets thinner.: The cervix usually does not dilate or become thin. °Follow these instructions at home: °· Take over-the-counter and prescription medicines only as told by your health care provider. °· Keep up with your usual exercises and follow other instructions from your health care provider. °· Eat and drink lightly if you think you are going into labor. °· If Braxton Hicks contractions are making you uncomfortable: °? Change your position from lying down or resting to walking, or change from walking to resting. °? Sit and rest in a tub of warm water. °? Drink enough fluid to keep your urine clear or pale yellow. Dehydration may cause these contractions. °? Do slow and deep breathing several times an hour. °· Keep all follow-up prenatal visits as told by your health care provider. This is important. °Contact a health care provider if: °· You have a  fever. °· You have continuous pain in your abdomen. °Get help right away if: °· Your contractions become stronger, more regular, and closer together. °· You have fluid leaking or gushing from your vagina. °· You pass blood-tinged mucus (bloody show). °· You have bleeding from your vagina. °· You have low back pain that you never had before. °· You feel your baby’s head pushing down and causing pelvic pressure. °· Your baby is not moving inside you as much as it used to. °Summary °· Contractions that occur before labor are called Braxton Hicks contractions, false labor, or practice contractions. °· Braxton Hicks contractions are usually shorter, weaker, farther apart, and less regular than true labor contractions. True labor contractions usually become progressively stronger and regular and they become more frequent. °· Manage discomfort from Braxton Hicks contractions by changing position, resting in a warm bath, drinking plenty of water, or practicing deep breathing. °This information is not intended to replace advice given to you by your health care provider. Make sure you discuss any questions you have with your health care provider. °Document Released: 02/27/2005 Document Revised: 01/17/2016 Document Reviewed: 01/17/2016 °Elsevier Interactive Patient Education © 2017 Elsevier Inc. ° °

## 2017-02-28 ENCOUNTER — Encounter (HOSPITAL_COMMUNITY): Payer: Self-pay

## 2017-03-09 ENCOUNTER — Other Ambulatory Visit: Payer: Self-pay

## 2017-03-09 ENCOUNTER — Ambulatory Visit (HOSPITAL_COMMUNITY)
Admission: RE | Admit: 2017-03-09 | Discharge: 2017-03-09 | Disposition: A | Discharge status: Home / Self Care | Payer: Medicaid Other | Source: Ambulatory Visit

## 2017-03-09 ENCOUNTER — Other Ambulatory Visit (HOSPITAL_COMMUNITY): Payer: Self-pay | Admitting: *Deleted

## 2017-03-09 ENCOUNTER — Encounter (HOSPITAL_COMMUNITY): Payer: Self-pay

## 2017-03-09 DIAGNOSIS — O0933 Supervision of pregnancy with insufficient antenatal care, third trimester: Secondary | ICD-10-CM | POA: Diagnosis not present

## 2017-03-09 DIAGNOSIS — O09213 Supervision of pregnancy with history of pre-term labor, third trimester: Secondary | ICD-10-CM

## 2017-03-09 DIAGNOSIS — Z3A35 35 weeks gestation of pregnancy: Secondary | ICD-10-CM

## 2017-03-09 DIAGNOSIS — Z3689 Encounter for other specified antenatal screening: Secondary | ICD-10-CM | POA: Diagnosis present

## 2017-03-09 DIAGNOSIS — Z0489 Encounter for examination and observation for other specified reasons: Secondary | ICD-10-CM

## 2017-03-09 DIAGNOSIS — Z3480 Encounter for supervision of other normal pregnancy, unspecified trimester: Secondary | ICD-10-CM

## 2017-03-09 DIAGNOSIS — IMO0002 Reserved for concepts with insufficient information to code with codable children: Secondary | ICD-10-CM

## 2017-03-13 NOTE — L&D Delivery Note (Signed)
Delivery Note At 1:10 AM a viable female was delivered via Vaginal, Spontaneous (Presentation: vertex; LOA) over an intact perineum. Head delivered LOA. No nuchal cord present. Shoulder and body delivered in usual fashion. Infant placed on mother's abdomen, dried and bulb suctioned. Cord clamped x 2 after 1-minute delay, and cut by family member. Cord blood drawn. After 1 minute, the cord was clamped and cut. 40 units of pitocin diluted in 1000cc LR was infused rapidly IV.  The placenta separated spontaneously and delivered via CCT and maternal pushing effort.  It was inspected and appears to be intact with a 3 VC.     APGAR: 8, 9; weight pending.   Placenta status: intact, to L&D.  Cord: 3V without complications.  Cord pH: not sent  Anesthesia: epidural  Episiotomy: None Lacerations: None Suture Repair: n/a Est. Blood Loss (mL): 100  Mom to postpartum.  Baby to Couplet care / Skin to Skin.  Alroy BailiffParker W Henryetta Corriveau 04/14/2017, 1:21 AM

## 2017-03-14 ENCOUNTER — Ambulatory Visit (INDEPENDENT_AMBULATORY_CARE_PROVIDER_SITE_OTHER): Payer: Medicaid Other | Admitting: Student

## 2017-03-14 VITALS — BP 131/78 | HR 100 | Wt 211.0 lb

## 2017-03-14 DIAGNOSIS — Z3480 Encounter for supervision of other normal pregnancy, unspecified trimester: Secondary | ICD-10-CM

## 2017-03-14 DIAGNOSIS — Z113 Encounter for screening for infections with a predominantly sexual mode of transmission: Secondary | ICD-10-CM

## 2017-03-14 DIAGNOSIS — Z3483 Encounter for supervision of other normal pregnancy, third trimester: Secondary | ICD-10-CM

## 2017-03-14 LAB — OB RESULTS CONSOLE GBS: STREP GROUP B AG: NEGATIVE

## 2017-03-14 LAB — OB RESULTS CONSOLE GC/CHLAMYDIA: Gonorrhea: NEGATIVE

## 2017-03-14 NOTE — Progress Notes (Signed)
   PRENATAL VISIT NOTE  Subjective:  Tonya Joseph is a 22 y.o. G2P0101 at 10968w0d being seen today for ongoing prenatal care.  She is currently monitored for the following issues for this low-risk pregnancy and has Supervision of other normal pregnancy, antepartum; Hx of preterm delivery, currently pregnant; Limited prenatal care; and Rh negative state in antepartum period on their problem list.  Patient reports contractions since 03/12/2017.  Contractions: Irritability. Vag. Bleeding: None.  Movement: Present. Denies leaking of fluid.   The following portions of the patient's history were reviewed and updated as appropriate: allergies, current medications, past family history, past medical history, past social history, past surgical history and problem list. Problem list updated.  Objective:   Vitals:   03/14/17 1132  BP: 131/78  Pulse: 100  Weight: 211 lb (95.7 kg)    Fetal Status: Fetal Heart Rate (bpm): 142 Fundal Height: 34 cm Movement: Present  Presentation: Vertex  General:  Alert, oriented and cooperative. Patient is in no acute distress.  Skin: Skin is warm and dry. No rash noted.   Cardiovascular: Normal heart rate noted  Respiratory: Normal respiratory effort, no problems with respiration noted  Abdomen: Soft, gravid, appropriate for gestational age.  Pain/Pressure: Present     Pelvic: Cervical exam performed Dilation: Closed Effacement (%): 70 Station: -1  Extremities: Normal range of motion.  Edema: None  Mental Status:  Normal mood and affect. Normal behavior. Normal judgment and thought content.   Assessment and Plan:  Pregnancy: G2P0101 at 268w0d  1. Supervision of other normal pregnancy, antepartum - Reassurance given that the contractions she has been having are not dilating her cervix - Culture, beta strep (group b only) - GC[facility]Chlamydia probe amp ([place]  Preterm labor symptoms and general obstetric precautions including but not limited to  vaginal bleeding, contractions, leaking of fluid and fetal movement were reviewed in detail with the patient. Please refer to After Visit Summary for other counseling recommendations.  Return in about 1 week (around 03/21/2017) for Routine OB, Return OB visit.   Raelyn Moraolitta Malikye Reppond, CNM

## 2017-03-14 NOTE — Progress Notes (Signed)
Pt stated having cramps between the thigh for 2 days.

## 2017-03-14 NOTE — Patient Instructions (Addendum)
Third Trimester of Pregnancy The third trimester is from week 29 through week 42, months 7 through 9. This trimester is when your unborn baby (fetus) is growing very fast. At the end of the ninth month, the unborn baby is about 20 inches in length. It weighs about 6-10 pounds. Follow these instructions at home:  Avoid all smoking, herbs, and alcohol. Avoid drugs not approved by your doctor.  Do not use any tobacco products, including cigarettes, chewing tobacco, and electronic cigarettes. If you need help quitting, ask your doctor. You may get counseling or other support to help you quit.  Only take medicine as told by your doctor. Some medicines are safe and some are not during pregnancy.  Exercise only as told by your doctor. Stop exercising if you start having cramps.  Eat regular, healthy meals.  Wear a good support bra if your breasts are tender.  Do not use hot tubs, steam rooms, or saunas.  Wear your seat belt when driving.  Avoid raw meat, uncooked cheese, and liter boxes and soil used by cats.  Take your prenatal vitamins.  Take 1500-2000 milligrams of calcium daily starting at the 20th week of pregnancy until you deliver your baby.  Try taking medicine that helps you poop (stool softener) as needed, and if your doctor approves. Eat more fiber by eating fresh fruit, vegetables, and whole grains. Drink enough fluids to keep your pee (urine) clear or pale yellow.  Take warm water baths (sitz baths) to soothe pain or discomfort caused by hemorrhoids. Use hemorrhoid cream if your doctor approves.  If you have puffy, bulging veins (varicose veins), wear support hose. Raise (elevate) your feet for 15 minutes, 3-4 times a day. Limit salt in your diet.  Avoid heavy lifting, wear low heels, and sit up straight.  Rest with your legs raised if you have leg cramps or low back pain.  Visit your dentist if you have not gone during your pregnancy. Use a soft toothbrush to brush your  teeth. Be gentle when you floss.  You can have sex (intercourse) unless your doctor tells you not to.  Do not travel far distances unless you must. Only do so with your doctor's approval.  Take prenatal classes.  Practice driving to the hospital.  Pack your hospital bag.  Prepare the baby's room.  Go to your doctor visits. Get help if:  You are not sure if you are in labor or if your water has broken.  You are dizzy.  You have mild cramps or pressure in your lower belly (abdominal).  You have a nagging pain in your belly area.  You continue to feel sick to your stomach (nauseous), throw up (vomit), or have watery poop (diarrhea).  You have bad smelling fluid coming from your vagina.  You have pain with peeing (urination). Get help right away if:  You have a fever.  You are leaking fluid from your vagina.  You are spotting or bleeding from your vagina.  You have severe belly cramping or pain.  You lose or gain weight rapidly.  You have trouble catching your breath and have chest pain.  You notice sudden or extreme puffiness (swelling) of your face, hands, ankles, feet, or legs.  You have not felt the baby move in over an hour.  You have severe headaches that do not go away with medicine.  You have vision changes. This information is not intended to replace advice given to you by your health care provider. Make   sure you discuss any questions you have with your health care provider. Document Released: 05/24/2009 Document Revised: 08/05/2015 Document Reviewed: 04/30/2012 Elsevier Interactive Patient Education  2017 [employer]  Preventing Preterm Birth Preterm birth is when your baby is delivered between 20 weeks and 37 weeks of pregnancy. A full-term pregnancy lasts for at least 37 weeks. Preterm birth can be dangerous for your baby because the last few weeks of pregnancy are an important time for your baby's brain and lungs to grow. Many things can cause a  baby to be born early. Sometimes the cause is not known. There are certain factors that make you more likely to experience preterm birth, such as:  Having a previous baby born preterm.  Being pregnant with twins or other multiples.  Having had fertility treatment.  Being overweight or underweight at the start of your pregnancy.  Having any of the following during pregnancy: ? An infection, including a urinary tract infection (UTI) or an STI (sexually transmitted infection). ? High blood pressure. ? Diabetes. ? Vaginal bleeding.  Being age 22 or older.  Being age 22 or younger.  Getting pregnant within 6 months of a previous pregnancy.  Suffering extreme stress or physical or emotional abuse during pregnancy.  Standing for long periods of time during pregnancy, such as working at a job that requires standing.  What are the risks? The most serious risk of preterm birth is that the baby may not survive. This is more likely to happen if a baby is born before 34 weeks. Other risks and complications of preterm birth may include your baby having:  Breathing problems.  Brain damage that affects movement and coordination (cerebral palsy).  Feeding difficulties.  Vision or hearing problems.  Infections or inflammation of the digestive tract (colitis).  Developmental delays.  Learning disabilities.  Higher risk for diabetes, heart disease, and high blood pressure later in life.  What can I do to lower my risk? Medical care  The most important thing you can do to lower your risk for preterm birth is to get routine medical care during pregnancy (prenatal care). If you have a high risk of preterm birth, you may be referred to a health care provider who specializes in managing high-risk pregnancies (perinatologist). You may be given medicine to help prevent preterm birth. Lifestyle changes Certain lifestyle changes can also lower your risk of preterm birth:  Wait at least 6  months after a pregnancy to become pregnant again.  Try to plan pregnancy for when you are between 7219 and 22 years old.  Get to a healthy weight before getting pregnant. If you are overweight, work with your health care provider to safely lose weight.  Do not use any products that contain nicotine or tobacco, such as cigarettes and e-cigarettes. If you need help quitting, ask your health care provider.  Do not drink alcohol.  Do not use drugs.  Where to find support: For more support, consider:  Talking with your health care provider.  Talking with a therapist or substance abuse counselor, if you need help quitting.  Working with a diet and nutrition specialist (dietitian) or a Systems analystpersonal trainer to maintain a healthy weight.  Joining a support group.  Where to find more information: Learn more about preventing preterm birth from:  Centers for Disease Control and Prevention: http://curry.org/cdc.gov/reproductivehealth/maternalinfanthealth/pretermbirth.htm  March of Dimes: marchofdimes.org/complications/premature-babies.aspx  American Pregnancy Association: americanpregnancy.org/labor-and-birth/premature-labor  Contact a health care provider if:  You have any of the following signs of preterm labor  before 37 weeks: ? A change or increase in vaginal discharge. ? Fluid leaking from your vagina. ? Pressure or cramps in your lower abdomen. ? A backache that does not go away or gets worse. ? Regular tightening (contractions) in your lower abdomen. Summary  Preterm birth means having your baby during weeks 20-37 of pregnancy.  Preterm birth may put your baby at risk for physical and mental problems.  Getting good prenatal care can help prevent preterm birth.  You can lower your risk of preterm birth by making certain lifestyle changes, such as not smoking and not using alcohol. This information is not intended to replace advice given to you by your health care provider. Make sure you discuss  any questions you have with your health care provider. Document Released: 04/13/2015 Document Revised: 11/06/2015 Document Reviewed: 11/06/2015 Elsevier Interactive Patient Education  2018 [employer].  Preterm Labor and Birth Information Pregnancy normally lasts 39-41 weeks. Preterm labor is when labor starts early. It starts before you have been pregnant for 37 whole weeks. What are the risk factors for preterm labor? Preterm labor is more likely to occur in women who:  Have an infection while pregnant.  Have a cervix that is short.  Have gone into preterm labor before.  Have had surgery on their cervix.  Are younger than age 95.  Are older than age 59.  Are African American.  Are pregnant with two or more babies.  Take street drugs while pregnant.  Smoke while pregnant.  Do not gain enough weight while pregnant.  Got pregnant right after another pregnancy.  What are the symptoms of preterm labor? Symptoms of preterm labor include:  Cramps. The cramps may feel like the cramps some women get during their period. The cramps may happen with watery poop (diarrhea).  Pain in the belly (abdomen).  Pain in the lower back.  Regular contractions or tightening. It may feel like your belly is getting tighter.  Pressure in the lower belly that seems to get stronger.  More fluid (discharge) leaking from the vagina. The fluid may be watery or bloody.  Water breaking.  Why is it important to notice signs of preterm labor? Babies who are born early may not be fully developed. They have a higher chance for:  Long-term heart problems.  Long-term lung problems.  Trouble controlling body systems, like breathing.  Bleeding in the brain.  A condition called cerebral palsy.  Learning difficulties.  Death.  These risks are highest for babies who are born before 34 weeks of pregnancy. How is preterm labor treated? Treatment depends on:  How long you were  pregnant.  Your condition.  The health of your baby.  Treatment may involve:  Having a stitch (suture) placed in your cervix. When you give birth, your cervix opens so the baby can come out. The stitch keeps the cervix from opening too soon.  Staying at the hospital.  Taking or getting medicines, such as: ? Hormone medicines. ? Medicines to stop contractions. ? Medicines to help the baby's lungs develop. ? Medicines to prevent your baby from having cerebral palsy.  What should I do if I am in preterm labor? If you think you are going into labor too soon, call your doctor right away. How can I prevent preterm labor?  Do not use any tobacco products. ? Examples of these are cigarettes, chewing tobacco, and e-cigarettes. ? If you need help quitting, ask your doctor.  Do not use street drugs.  Do  not use any medicines unless you ask your doctor if they are safe for you.  Talk with your doctor before taking any herbal supplements.  Make sure you gain enough weight.  Watch for infection. If you think you might have an infection, get it checked right away.  If you have gone into preterm labor before, tell your doctor. This information is not intended to replace advice given to you by your health care provider. Make sure you discuss any questions you have with your health care provider. Document Released: 05/26/2008 Document Revised: 08/10/2015 Document Reviewed: 07/21/2015 Elsevier Interactive Patient Education  2018 [employer].

## 2017-03-15 LAB — GC/CHLAMYDIA PROBE AMP (~~LOC~~) NOT AT ARMC
CHLAMYDIA, DNA PROBE: NEGATIVE
Neisseria Gonorrhea: NEGATIVE

## 2017-03-18 LAB — CULTURE, BETA STREP (GROUP B ONLY): STREP GP B CULTURE: NEGATIVE

## 2017-03-21 ENCOUNTER — Ambulatory Visit (INDEPENDENT_AMBULATORY_CARE_PROVIDER_SITE_OTHER): Payer: Self-pay | Admitting: Obstetrics & Gynecology

## 2017-03-21 VITALS — BP 118/96 | HR 99 | Wt 216.0 lb

## 2017-03-21 DIAGNOSIS — Z3480 Encounter for supervision of other normal pregnancy, unspecified trimester: Secondary | ICD-10-CM

## 2017-03-21 NOTE — Progress Notes (Signed)
   PRENATAL VISIT NOTE  Subjective:  Tonya Joseph is a 22 y.o. G2P0101 at 8224w0d being seen today for ongoing prenatal care.  She is currently monitored for the following issues for this low-risk pregnancy and has Supervision of other normal pregnancy, antepartum; Hx of preterm delivery, currently pregnant; Limited prenatal care; and Rh negative state in antepartum period on their problem list.  Patient reports no complaints.  Contractions: Irritability. Vag. Bleeding: None.  Movement: Present. Denies leaking of fluid.   The following portions of the patient's history were reviewed and updated as appropriate: allergies, current medications, past family history, past medical history, past social history, past surgical history and problem list. Problem list updated.  Objective:   Vitals:   03/21/17 1619  BP: (!) 118/96  Pulse: 99  Weight: 216 lb (98 kg)    Fetal Status: Fetal Heart Rate (bpm): 147   Movement: Present     General:  Alert, oriented and cooperative. Patient is in no acute distress.  Skin: Skin is warm and dry. No rash noted.   Cardiovascular: Normal heart rate noted  Respiratory: Normal respiratory effort, no problems with respiration noted  Abdomen: Soft, gravid, appropriate for gestational age.  Pain/Pressure: Present     Pelvic: Cervical exam performed        Extremities: Normal range of motion.  Edema: None  Mental Status:  Normal mood and affect. Normal behavior. Normal judgment and thought content.   Assessment and Plan:  Pregnancy: G2P0101 at 8624w0d  1. Supervision of other normal pregnancy, antepartum   Term labor symptoms and general obstetric precautions including but not limited to vaginal bleeding, contractions, leaking of fluid and fetal movement were reviewed in detail with the patient. Please refer to After Visit Summary for other counseling recommendations.  Return in about 1 week (around 03/28/2017).   Allie BossierMyra C Dinna Severs, MD

## 2017-03-30 ENCOUNTER — Ambulatory Visit (INDEPENDENT_AMBULATORY_CARE_PROVIDER_SITE_OTHER): Payer: Self-pay | Admitting: Obstetrics & Gynecology

## 2017-03-30 VITALS — BP 128/74 | HR 105 | Wt 217.9 lb

## 2017-03-30 DIAGNOSIS — O09219 Supervision of pregnancy with history of pre-term labor, unspecified trimester: Secondary | ICD-10-CM

## 2017-03-30 DIAGNOSIS — Z6791 Unspecified blood type, Rh negative: Secondary | ICD-10-CM

## 2017-03-30 DIAGNOSIS — O09899 Supervision of other high risk pregnancies, unspecified trimester: Secondary | ICD-10-CM

## 2017-03-30 DIAGNOSIS — O0933 Supervision of pregnancy with insufficient antenatal care, third trimester: Secondary | ICD-10-CM

## 2017-03-30 DIAGNOSIS — O26899 Other specified pregnancy related conditions, unspecified trimester: Secondary | ICD-10-CM

## 2017-03-30 DIAGNOSIS — Z3480 Encounter for supervision of other normal pregnancy, unspecified trimester: Secondary | ICD-10-CM

## 2017-03-30 NOTE — Patient Instructions (Addendum)
Vaginal Delivery Vaginal delivery means that you will give birth by pushing your baby out of your birth canal (vagina). A team of health care providers will help you before, during, and after vaginal delivery. Birth experiences are unique for every woman and every pregnancy, and birth experiences vary depending on where you choose to give birth. What should I do to prepare for my baby's birth? Before your baby is born, it is important to talk with your health care provider about:  Your labor and delivery preferences. These may include: ? Medicines that you may be given. ? How you will manage your pain. This might include non-medical pain relief techniques or injectable pain relief such as epidural analgesia. ? How you and your baby will be monitored during labor and delivery. ? Who may be in the labor and delivery room with you. ? Your feelings about surgical delivery of your baby (cesarean delivery, or C-section) if this becomes necessary. ? Your feelings about receiving donated blood through an IV tube (blood transfusion) if this becomes necessary.  Whether you are able: ? To take pictures or videos of the birth. ? To eat during labor and delivery. ? To move around, walk, or change positions during labor and delivery.  What to expect after your baby is born, such as: ? Whether delayed umbilical cord clamping and cutting is offered. ? Who will care for your baby right after birth. ? Medicines or tests that may be recommended for your baby. ? Whether breastfeeding is supported in your hospital or birth center. ? How long you will be in the hospital or birth center.  How any medical conditions you have may affect your baby or your labor and delivery experience.  To prepare for your baby's birth, you should also:  Attend all of your health care visits before delivery (prenatal visits) as recommended by your health care provider. This is important.  Prepare your home for your baby's  arrival. Make sure that you have: ? Diapers. ? Baby clothing. ? Feeding equipment. ? Safe sleeping arrangements for you and your baby.  Install a car seat in your vehicle. Have your car seat checked by a certified car seat installer to make sure that it is installed safely.  Think about who will help you with your new baby at home for at least the first several weeks after delivery.  What can I expect when I arrive at the birth center or hospital? Once you are in labor and have been admitted into the hospital or birth center, your health care provider may:  Review your pregnancy history and any concerns you have.  Insert an IV tube into one of your veins. This is used to give you fluids and medicines.  Check your blood pressure, pulse, temperature, and heart rate (vital signs).  Check whether your bag of water (amniotic sac) has broken (ruptured).  Talk with you about your birth plan and discuss pain control options.  Monitoring Your health care provider may monitor your contractions (uterine monitoring) and your baby's heart rate (fetal monitoring). You may need to be monitored:  Often, but not continuously (intermittently).  All the time or for long periods at a time (continuously). Continuous monitoring may be needed if: ? You are taking certain medicines, such as medicine to relieve pain or make your contractions stronger. ? You have pregnancy or labor complications.  Monitoring may be done by:  Placing a special stethoscope or a handheld monitoring device on your abdomen to   check your baby's heartbeat, and feeling your abdomen for contractions. This method of monitoring does not continuously record your baby's heartbeat or your contractions.  Placing monitors on your abdomen (external monitors) to record your baby's heartbeat and the frequency and length of contractions. You may not have to wear external monitors all the time.  Placing monitors inside of your uterus  (internal monitors) to record your baby's heartbeat and the frequency, length, and strength of your contractions. ? Your health care provider may use internal monitors if he or she needs more information about the strength of your contractions or your baby's heart rate. ? Internal monitors are put in place by passing a thin, flexible wire through your vagina and into your uterus. Depending on the type of monitor, it may remain in your uterus or on your baby's head until birth. ? Your health care provider will discuss the benefits and risks of internal monitoring with you and will ask for your permission before inserting the monitors.  Telemetry. This is a type of continuous monitoring that can be done with external or internal monitors. Instead of having to stay in bed, you are able to move around during telemetry. Ask your health care provider if telemetry is an option for you.  Physical exam Your health care provider may perform a physical exam. This may include:  Checking whether your baby is positioned: ? With the head toward your vagina (head-down). This is most common. ? With the head toward the top of your uterus (head-up or breech). If your baby is in a breech position, your health care provider may try to turn your baby to a head-down position so you can deliver vaginally. If it does not seem that your baby can be born vaginally, your provider may recommend surgery to deliver your baby. In rare cases, you may be able to deliver vaginally if your baby is head-up (breech delivery). ? Lying sideways (transverse). Babies that are lying sideways cannot be delivered vaginally.  Checking your cervix to determine: ? Whether it is thinning out (effacing). ? Whether it is opening up (dilating). ? How low your baby has moved into your birth canal.  What are the three stages of labor and delivery?  Normal labor and delivery is divided into the following three stages: Stage 1  Stage 1 is the  longest stage of labor, and it can last for hours or days. Stage 1 includes: ? Early labor. This is when contractions may be irregular, or regular and mild. Generally, early labor contractions are more than 10 minutes apart. ? Active labor. This is when contractions get longer, more regular, more frequent, and more intense. ? The transition phase. This is when contractions happen very close together, are very intense, and may last longer than during any other part of labor.  Contractions generally feel mild, infrequent, and irregular at first. They get stronger, more frequent (about every 2-3 minutes), and more regular as you progress from early labor through active labor and transition.  Many women progress through stage 1 naturally, but you may need help to continue making progress. If this happens, your health care provider may talk with you about: ? Rupturing your amniotic sac if it has not ruptured yet. ? Giving you medicine to help make your contractions stronger and more frequent.  Stage 1 ends when your cervix is completely dilated to 4 inches (10 cm) and completely effaced. This happens at the end of the transition phase. Stage 2  Once   your cervix is completely effaced and dilated to 4 inches (10 cm), you may start to feel an urge to push. It is common for the body to naturally take a rest before feeling the urge to push, especially if you received an epidural or certain other pain medicines. This rest period may last for up to 1-2 hours, depending on your unique labor experience.  During stage 2, contractions are generally less painful, because pushing helps relieve contraction pain. Instead of contraction pain, you may feel stretching and burning pain, especially when the widest part of your baby's head passes through the vaginal opening (crowning).  Your health care provider will closely monitor your pushing progress and your baby's progress through the vagina during stage 2.  Your  health care provider may massage the area of skin between your vaginal opening and anus (perineum) or apply warm compresses to your perineum. This helps it stretch as the baby's head starts to crown, which can help prevent perineal tearing. ? In some cases, an incision may be made in your perineum (episiotomy) to allow the baby to pass through the vaginal opening. An episiotomy helps to make the opening of the vagina larger to allow more room for the baby to fit through.  It is very important to breathe and focus so your health care provider can control the delivery of your baby's head. Your health care provider may have you decrease the intensity of your pushing, to help prevent perineal tearing.  After delivery of your baby's head, the shoulders and the rest of the body generally deliver very quickly and without difficulty.  Once your baby is delivered, the umbilical cord may be cut right away, or this may be delayed for 1-2 minutes, depending on your baby's health. This may vary among health care providers, hospitals, and birth centers.  If you and your baby are healthy enough, your baby may be placed on your chest or abdomen to help maintain the baby's temperature and to help you bond with each other. Some mothers and babies start breastfeeding at this time. Your health care team will dry your baby and help keep your baby warm during this time.  Your baby may need immediate care if he or she: ? Showed signs of distress during labor. ? Has a medical condition. ? Was born too early (prematurely). ? Had a bowel movement before birth (meconium). ? Shows signs of difficulty transitioning from being inside the uterus to being outside of the uterus. If you are planning to breastfeed, your health care team will help you begin a feeding. Stage 3  The third stage of labor starts immediately after the birth of your baby and ends after you deliver the placenta. The placenta is an organ that develops  during pregnancy to provide oxygen and nutrients to your baby in the womb.  Delivering the placenta may require some pushing, and you may have mild contractions. Breastfeeding can stimulate contractions to help you deliver the placenta.  After the placenta is delivered, your uterus should tighten (contract) and become firm. This helps to stop bleeding in your uterus. To help your uterus contract and to control bleeding, your health care provider may: ? Give you medicine by injection, through an IV tube, by mouth, or through your rectum (rectally). ? Massage your abdomen or perform a vaginal exam to remove any blood clots that are left in your uterus. ? Empty your bladder by placing a thin, flexible tube (catheter) into your bladder. ? Encourage   you to breastfeed your baby. After labor is over, you and your baby will be monitored closely to ensure that you are both healthy until you are ready to go home. Your health care team will teach you how to care for yourself and your baby. This information is not intended to replace advice given to you by your health care provider. Make sure you discuss any questions you have with your health care provider. Document Released: 12/07/2007 Document Revised: 09/17/2015 Document Reviewed: 03/14/2015 Elsevier Interactive Patient Education  2018 [employer]. AREA PEDIATRIC/FAMILY PRACTICE PHYSICIANS  [facility] [address] [place], [place]  [address] Phone - [phone]   Fax - [phone]  [facility] [address] [place], [place] [address] Phone - [phone]   Fax - [phone]  JACK AMOS [address] [place], [place]  [address] Phone - [phone]   Fax - [phone]  [facility] [address] [place], [place]  [address] Phone - [phone]   Fax - [phone]  [facility] [address] [place], [place]  [address] Phone - [phone]    Fax - [phone]  [facility] [address] [place], [place]  [address] Phone - [phone]   Fax - [phone]  [facility] [address] [place], [place]  [address] Phone - [phone]   Fax - [phone]  [facility] AT [facility] [address] [place], [address] [place], [place]  [address] Phone - [phone]   Fax - [phone]  [facility] AT [facility] [address] [place], [place]  [address] Phone - [phone]   Fax - [phone] [facility] AT [place] [address] [place], [place]  [address] Phone - [phone]   Fax - [phone]  [facility] [address] [place], [place]  [address] Phone - [phone]   Fax - [phone]  [facility] [address] [place], [place]  [address] Phone - [phone]   Fax - [phone]  [facility] [place] [address] [place], [place]  [address] Phone - [phone]   Fax - [phone]  [facility] [address] [place], [place]  [address] Phone - [phone]  [facility] [address] [place], [place]  [address] Phone - [phone]   Fax - [phone]  [facility] [address] [place], [place]  [address] Phone - [phone]   Fax - [phone]  [facility] [address] [place], [place]  [address] Phone - [phone]   Fax - [phone]  [facility] [address] [place], [place]  [address] Phone - [phone]   Fax - [phone]   [facility] [address] [place], [place]  [address] Phone - [phone]   Fax - [phone]  [facility] [address] [place], [place]  [address] Phone - [phone]   Fax - [phone]  DAVID RUBIN [address] [place], [place]  [address] Phone - [phone]   Fax - [phone]  [facility] [address] [place], [place]  [address] Phone - [phone]   Fax - [phone]  [facility] [address] [place], [place]  [address] Phone - [phone]   Fax - [phone] Gerarda Fraction [address] [place],  [place]  [address] Phone - [phone]   Fax - [phone]  [facility] [address] [place], [place]  [address] Phone - [phone]   Fax - [phone]  [facility] [place] [address] [place], [place]  [address] Phone - [phone]   Fax - [phone]  [facility] [place] Wyvonne Lenzharlene Flemming MD [address] [place] [address] Phone [phone]  Fax [phone]

## 2017-03-30 NOTE — Progress Notes (Signed)
   PRENATAL VISIT NOTE  Subjective:  Tonya Joseph is a 22 y.o. G2P0101 at [redacted]w[redacted]d being seen today for ongoing prenatal care.  She is currently monitored for the following issues for this low-risk pregnancy and has Supervision of other normal pregnancy, antepartum; Hx of preterm delivery, currently pregnant; Limited prenatal care; and Rh negative state in antepartum period on their problem list.  Patient reports no complaints. Pt denies LOF or VB or ctx.  The following portions of the patient's history were reviewed and updated as appropriate: allergies, current medications, past family history, past medical history, past social history, past surgical history and problem list. Problem list updated.  Objective:  There were no vitals filed for this visit.  Fetal Status:           General:  Alert, oriented and cooperative. Patient is in no acute distress.  Skin: Skin is warm and dry. No rash noted.   Cardiovascular: Normal heart rate noted  Respiratory: Normal respiratory effort, no problems with respiration noted  Abdomen: Soft, gravid, appropriate for gestational age.        Pelvic: Cervical exam deferred        Extremities: Normal range of motion.     Mental Status:  Normal mood and affect. Normal behavior. Normal judgment and thought content.   Assessment and Plan:  Pregnancy: G2P0101 at [redacted]w[redacted]d  1. Supervision of other normal pregnancy, antepartum  2. Rh negative state in antepartum period Needs rhogam in labor  3. Limited prenatal care in third trimester  4. Hx of preterm delivery, currently pregnant   Term labor symptoms and general obstetric precautions including but not limited to vaginal bleeding, contractions, leaking of fluid and fetal movement were reviewed in detail with the patient. Please refer to After Visit Summary for other counseling recommendations.  Return in about 1 week (around 04/06/2017).   Willodean Rosenthalarolyn Harraway-Smith, MD

## 2017-04-06 ENCOUNTER — Encounter (HOSPITAL_COMMUNITY): Payer: Self-pay

## 2017-04-06 ENCOUNTER — Encounter: Payer: Self-pay | Admitting: Obstetrics & Gynecology

## 2017-04-06 ENCOUNTER — Ambulatory Visit (HOSPITAL_COMMUNITY)
Admission: RE | Admit: 2017-04-06 | Discharge: 2017-04-06 | Disposition: A | Discharge status: Home / Self Care | Payer: Medicaid Other | Source: Ambulatory Visit

## 2017-04-06 ENCOUNTER — Other Ambulatory Visit (HOSPITAL_COMMUNITY): Payer: Self-pay | Admitting: Obstetrics and Gynecology

## 2017-04-06 ENCOUNTER — Ambulatory Visit (INDEPENDENT_AMBULATORY_CARE_PROVIDER_SITE_OTHER): Payer: Self-pay | Admitting: Obstetrics & Gynecology

## 2017-04-06 VITALS — BP 131/84 | HR 97 | Wt 221.3 lb

## 2017-04-06 DIAGNOSIS — O0933 Supervision of pregnancy with insufficient antenatal care, third trimester: Secondary | ICD-10-CM

## 2017-04-06 DIAGNOSIS — Z3480 Encounter for supervision of other normal pregnancy, unspecified trimester: Secondary | ICD-10-CM

## 2017-04-06 DIAGNOSIS — O09899 Supervision of other high risk pregnancies, unspecified trimester: Secondary | ICD-10-CM

## 2017-04-06 DIAGNOSIS — Z362 Encounter for other antenatal screening follow-up: Secondary | ICD-10-CM | POA: Insufficient documentation

## 2017-04-06 DIAGNOSIS — Z3A39 39 weeks gestation of pregnancy: Secondary | ICD-10-CM | POA: Diagnosis not present

## 2017-04-06 DIAGNOSIS — O09219 Supervision of pregnancy with history of pre-term labor, unspecified trimester: Secondary | ICD-10-CM

## 2017-04-06 DIAGNOSIS — O26899 Other specified pregnancy related conditions, unspecified trimester: Secondary | ICD-10-CM

## 2017-04-06 DIAGNOSIS — Z6791 Unspecified blood type, Rh negative: Secondary | ICD-10-CM

## 2017-04-06 DIAGNOSIS — Z0489 Encounter for examination and observation for other specified reasons: Secondary | ICD-10-CM

## 2017-04-06 DIAGNOSIS — IMO0002 Reserved for concepts with insufficient information to code with codable children: Secondary | ICD-10-CM

## 2017-04-06 NOTE — Progress Notes (Signed)
Spotting , passing mucous

## 2017-04-06 NOTE — Progress Notes (Signed)
   PRENATAL VISIT NOTE  Subjective:  Tonya Joseph is a 22 y.o. G2P0101 at 10562w2d being seen today for ongoing prenatal care.  She is currently monitored for the following issues for this low-risk pregnancy and has Supervision of other normal pregnancy, antepartum; Hx of preterm delivery, currently pregnant; Limited prenatal care; and Rh negative state in antepartum period on their problem list.  Patient reports occasional contractions.  Contractions: Irregular. Vag. Bleeding: Scant.  Movement: Present. Denies leaking of fluid.   The following portions of the patient's history were reviewed and updated as appropriate: allergies, current medications, past family history, past medical history, past social history, past surgical history and problem list. Problem list updated.  Objective:   Vitals:   04/06/17 1036  BP: 131/84  Pulse: 97  Weight: 221 lb 4.8 oz (100.4 kg)    Fetal Status: Fetal Heart Rate (bpm): u/s   Movement: Present     General:  Alert, oriented and cooperative. Patient is in no acute distress.  Skin: Skin is warm and dry. No rash noted.   Cardiovascular: Normal heart rate noted  Respiratory: Normal respiratory effort, no problems with respiration noted  Abdomen: Soft, gravid, appropriate for gestational age.  Pain/Pressure: Present     Pelvic: Cervical exam performed        Extremities: Normal range of motion.  Edema: Trace  Mental Status:  Normal mood and affect. Normal behavior. Normal judgment and thought content.   Assessment and Plan:  Pregnancy: G2P0101 at 962w2d  1. Supervision of other normal pregnancy, antepartum Passed her mucous plug. Saw 2 drops of blood after mucous plug.   2. Rh negative state in antepartum period S/p rhogam  3. Limited prenatal care in third trimester  4. Hx of preterm delivery, currently pregnant   Term labor symptoms and general obstetric precautions including but not limited to vaginal bleeding, contractions, leaking of  fluid and fetal movement were reviewed in detail with the patient. Please refer to After Visit Summary for other counseling recommendations.  Return in about 1 week (around 04/13/2017).   Willodean Rosenthalarolyn Harraway-Smith, MD

## 2017-04-09 ENCOUNTER — Inpatient Hospital Stay (HOSPITAL_COMMUNITY)
Admission: AD | Admit: 2017-04-09 | Discharge: 2017-04-10 | Disposition: A | Discharge status: Home / Self Care | Payer: Medicaid Other | Source: Ambulatory Visit | Attending: Obstetrics and Gynecology | Admitting: Obstetrics and Gynecology

## 2017-04-09 DIAGNOSIS — O219 Vomiting of pregnancy, unspecified: Secondary | ICD-10-CM | POA: Diagnosis present

## 2017-04-09 DIAGNOSIS — O212 Late vomiting of pregnancy: Secondary | ICD-10-CM | POA: Insufficient documentation

## 2017-04-09 DIAGNOSIS — Z87891 Personal history of nicotine dependence: Secondary | ICD-10-CM | POA: Diagnosis not present

## 2017-04-09 DIAGNOSIS — O26899 Other specified pregnancy related conditions, unspecified trimester: Secondary | ICD-10-CM | POA: Diagnosis present

## 2017-04-09 DIAGNOSIS — R51 Headache: Secondary | ICD-10-CM | POA: Diagnosis not present

## 2017-04-09 DIAGNOSIS — Z3A39 39 weeks gestation of pregnancy: Secondary | ICD-10-CM | POA: Diagnosis not present

## 2017-04-09 DIAGNOSIS — O26893 Other specified pregnancy related conditions, third trimester: Secondary | ICD-10-CM | POA: Diagnosis not present

## 2017-04-09 DIAGNOSIS — R519 Headache, unspecified: Secondary | ICD-10-CM

## 2017-04-09 LAB — COMPREHENSIVE METABOLIC PANEL
ALBUMIN: 2.9 g/dL — AB (ref 3.5–5.0)
ALT: 11 U/L — ABNORMAL LOW (ref 14–54)
ANION GAP: 9 (ref 5–15)
AST: 17 U/L (ref 15–41)
Alkaline Phosphatase: 95 U/L (ref 38–126)
BUN: 7 mg/dL (ref 6–20)
CALCIUM: 8.9 mg/dL (ref 8.9–10.3)
CO2: 19 mmol/L — ABNORMAL LOW (ref 22–32)
Chloride: 106 mmol/L (ref 101–111)
Creatinine, Ser: 0.59 mg/dL (ref 0.44–1.00)
GFR calc non Af Amer: >60 mL/min (ref >60)
GLUCOSE: 97 mg/dL (ref 65–99)
POTASSIUM: 3.8 mmol/L (ref 3.5–5.1)
Sodium: 134 mmol/L — ABNORMAL LOW (ref 135–145)
TOTAL PROTEIN: 7.9 g/dL (ref 6.5–8.1)
Total Bilirubin: 0.4 mg/dL (ref 0.3–1.2)

## 2017-04-09 LAB — CBC
HEMATOCRIT: 37.5 % (ref 36.0–46.0)
Hemoglobin: 12.8 g/dL (ref 12.0–15.0)
MCH: 28 pg (ref 26.0–34.0)
MCHC: 34.1 g/dL (ref 30.0–36.0)
MCV: 82.1 fL (ref 78.0–100.0)
Platelets: 173 10*3/uL (ref 150–400)
RBC: 4.57 MIL/uL (ref 3.87–5.11)
RDW: 13.1 % (ref 11.5–15.5)
WBC: 6.9 10*3/uL (ref 4.0–10.5)

## 2017-04-09 LAB — URINALYSIS, ROUTINE W REFLEX MICROSCOPIC
Bacteria, UA: NONE SEEN
Bilirubin Urine: NEGATIVE
Glucose, UA: >=500 mg/dL — AB
Hgb urine dipstick: NEGATIVE
Ketones, ur: NEGATIVE mg/dL
Leukocytes, UA: NEGATIVE
Nitrite: NEGATIVE
PH: 6 (ref 5.0–8.0)
Protein, ur: 30 mg/dL — AB
SPECIFIC GRAVITY, URINE: 1.021 (ref 1.005–1.030)

## 2017-04-09 LAB — PROTEIN / CREATININE RATIO, URINE
CREATININE, URINE: 154 mg/dL
PROTEIN CREATININE RATIO: 0.18 mg/mg{creat} — AB (ref 0.00–0.15)
TOTAL PROTEIN, URINE: 28 mg/dL

## 2017-04-09 MED ORDER — CYCLOBENZAPRINE HCL 10 MG PO TABS
10.0000 mg | ORAL_TABLET | Freq: Once | ORAL | Status: AC
Start: 2017-04-09 — End: 2017-04-09
  Administered 2017-04-09: 10 mg via ORAL
  Filled 2017-04-09: qty 1

## 2017-04-09 NOTE — MAU Provider Note (Signed)
History     CSN: [id number]  Arrival date and time: 04/09/17 2207   First Provider Initiated Contact with Patient 04/09/17 2248      Chief Complaint  Patient presents with  . Contractions   HPI   Ms.  Tonya Joseph is a 22 y.o. year old 622P0101 female at 7649w5d weeks gestation who presents to MAU reporting Labor, N/V & H/A. She reports vomiting 3x while coming to the hospital and once after arriving here. She has not taken anything for her H/A at this time.  Past Medical History:  Diagnosis Date  . Anxiety   . Asthma   . Hypertension   . Infection    UTI    Past Surgical History:  Procedure Laterality Date  . NO PAST SURGERIES      Family History  Problem Relation Age of Onset  . Diabetes Maternal Grandmother   . Hypertension Paternal Grandmother     Social History   Tobacco Use  . Smoking status: Former Games developermoker  . Smokeless tobacco: Never Used  . Tobacco comment: quit afteer asthma dx  Substance Use Topics  . Alcohol use: No    Frequency: Never  . Drug use: No    Allergies: No Known Allergies  Medications Prior to Admission  Medication Sig Dispense Refill Last Dose  . albuterol (PROVENTIL HFA;VENTOLIN HFA) 108 (90 Base) MCG/ACT inhaler Inhale 2 puffs every 6 (six) hours as needed into the lungs for wheezing or shortness of breath. 1 Inhaler 2 Taking  . Prenatal Vit-Fe Fumarate-FA (PRENATAL MULTIVITAMIN) TABS tablet Take 1 tablet daily at 12 noon by mouth.   Taking    Review of Systems  Constitutional: Negative.   HENT: Negative.   Eyes: Negative.   Respiratory: Negative.   Cardiovascular: Negative.   Gastrointestinal: Positive for abdominal pain (contractions), nausea and vomiting.  Endocrine: Negative.   Genitourinary: Positive for pelvic pain (contractions).  Musculoskeletal: Negative.   Skin: Negative.   Allergic/Immunologic: Negative.   Neurological: Positive for headaches.  Hematological: Negative.   Psychiatric/Behavioral: Negative.     Physical Exam   Patient Vitals for the past 24 hrs:  BP Pulse Height Weight  04/09/17 2346 (!) 112/58 100 - -  04/09/17 2330 133/76 (!) 108 - -  04/09/17 2316 114/70 (!) 110 - -  04/09/17 2308 114/64 99 - -  04/09/17 2226 137/83 99 - -  04/09/17 2217 - - 5\' 7"  (1.702 m) 221 lb (100.2 kg)    Blood pressure 137/83, pulse 99, height 5\' 7"  (1.702 m), weight 221 lb (100.2 kg), last menstrual period 05/24/2016.  Physical Exam  Nursing note and vitals reviewed. Constitutional: She is oriented to person, place, and time. She appears well-developed and well-nourished.  HENT:  Head: Normocephalic.  Eyes: Pupils are equal, round, and reactive to light.  Neck: Normal range of motion.  Cardiovascular: Normal rate, regular rhythm and normal heart sounds.  Respiratory: Effort normal and breath sounds normal.  GI: Soft. Bowel sounds are normal.  Genitourinary:  Genitourinary Comments: Dilation: 2 Effacement (%): Thick Cervical Position: Posterior Station: -3 Presentation: Vertex Exam by:: Latricia HeftAnna Cioce RN  Musculoskeletal: Normal range of motion.  Neurological: She is alert and oriented to person, place, and time.  Skin: Skin is warm and dry.  Psychiatric: She has a normal mood and affect. Her behavior is normal. Judgment and thought content normal.    MAU Course  Procedures  MDM CBC CMP P/C Ratio Flexeril 10 mg -- improved H/A NST - FHR:  140 bpm / moderate variability / accels present / decels absent / TOCO: irregular UCs  Results for orders placed or performed during the hospital encounter of 04/09/17 (from the past 24 hour(s))  CBC     Status: None   Collection Time: 04/09/17 10:45 PM  Result Value Ref Range   WBC 6.9 4.0 - 10.5 K/uL   RBC 4.57 3.87 - 5.11 MIL/uL   Hemoglobin 12.8 12.0 - 15.0 g/dL   HCT 78.2 95.6 - 21.3 %   MCV 82.1 78.0 - 100.0 fL   MCH 28.0 26.0 - 34.0 pg   MCHC 34.1 30.0 - 36.0 g/dL   RDW 08.6 57.8 - 46.9 %   Platelets 173 150 - 400 K/uL   Comprehensive metabolic panel     Status: Abnormal   Collection Time: 04/09/17 10:45 PM  Result Value Ref Range   Sodium 134 (L) 135 - 145 mmol/L   Potassium 3.8 3.5 - 5.1 mmol/L   Chloride 106 101 - 111 mmol/L   CO2 19 (L) 22 - 32 mmol/L   Glucose, Bld 97 65 - 99 mg/dL   BUN 7 6 - 20 mg/dL   Creatinine, Ser 6.29 0.44 - 1.00 mg/dL   Calcium 8.9 8.9 - 52.8 mg/dL   Total Protein 7.9 6.5 - 8.1 g/dL   Albumin 2.9 (L) 3.5 - 5.0 g/dL   AST 17 15 - 41 U/L   ALT 11 (L) 14 - 54 U/L   Alkaline Phosphatase 95 38 - 126 U/L   Total Bilirubin 0.4 0.3 - 1.2 mg/dL   GFR calc non Af Amer >60 >60 mL/min   GFR calc Af Amer >60 >60 mL/min   Anion gap 9 5 - 15  Protein / creatinine ratio, urine     Status: Abnormal   Collection Time: 04/09/17 10:47 PM  Result Value Ref Range   Creatinine, Urine 154.00 mg/dL   Total Protein, Urine 28 mg/dL   Protein Creatinine Ratio 0.18 (H) 0.00 - 0.15 mg/mg[Cre]  Urinalysis, Routine w reflex microscopic     Status: Abnormal   Collection Time: 04/09/17 10:47 PM  Result Value Ref Range   Color, Urine YELLOW YELLOW   APPearance HAZY (A) CLEAR   Specific Gravity, Urine 1.021 1.005 - 1.030   pH 6.0 5.0 - 8.0   Glucose, UA >=500 (A) NEGATIVE mg/dL   Hgb urine dipstick NEGATIVE NEGATIVE   Bilirubin Urine NEGATIVE NEGATIVE   Ketones, ur NEGATIVE NEGATIVE mg/dL   Protein, ur 30 (A) NEGATIVE mg/dL   Nitrite NEGATIVE NEGATIVE   Leukocytes, UA NEGATIVE NEGATIVE   RBC / HPF 0-5 0 - 5 RBC/hpf   WBC, UA 0-5 0 - 5 WBC/hpf   Bacteria, UA NONE SEEN NONE SEEN   Squamous Epithelial / LPF 0-5 (A) NONE SEEN   Mucus PRESENT     Assessment and Plan  Pregnancy headache in third trimester - Information given on H/A with unknown cause, HTN in pregnancy & PEC  Nausea/vomiting in pregnancy - Continue to hydrate with 8-10 glasses of water daily  Discharge home Keep scheduled appt on 04/12/17 at [facility] Patient verbalized an understanding of the plan of care and agrees.   Raelyn Mora, MSN, CNM 04/09/2017, 10:48 PM

## 2017-04-09 NOTE — MAU Note (Signed)
Pt reports uc's for one hour q 2-3 minute. Vomited in car on ride over, had some leaking. Unsure if its her bag of water or not

## 2017-04-10 DIAGNOSIS — O219 Vomiting of pregnancy, unspecified: Secondary | ICD-10-CM

## 2017-04-10 DIAGNOSIS — R51 Headache: Secondary | ICD-10-CM

## 2017-04-10 DIAGNOSIS — O26893 Other specified pregnancy related conditions, third trimester: Secondary | ICD-10-CM | POA: Diagnosis not present

## 2017-04-12 ENCOUNTER — Telehealth: Payer: Self-pay

## 2017-04-12 ENCOUNTER — Ambulatory Visit (INDEPENDENT_AMBULATORY_CARE_PROVIDER_SITE_OTHER): Payer: Medicaid Other | Admitting: Student

## 2017-04-12 VITALS — BP 127/82 | HR 105 | Wt 225.1 lb

## 2017-04-12 DIAGNOSIS — Z3483 Encounter for supervision of other normal pregnancy, third trimester: Secondary | ICD-10-CM | POA: Diagnosis present

## 2017-04-12 DIAGNOSIS — Z3480 Encounter for supervision of other normal pregnancy, unspecified trimester: Secondary | ICD-10-CM

## 2017-04-12 DIAGNOSIS — O480 Post-term pregnancy: Secondary | ICD-10-CM | POA: Insufficient documentation

## 2017-04-12 NOTE — Telephone Encounter (Signed)
Called pt and asked if she could come in tomorrow for repeat NST and to also have BPP.  Pt agreed to appt tomorrow @ 1015.

## 2017-04-12 NOTE — Progress Notes (Signed)
   PRENATAL VISIT NOTE  Subjective:  Tonya Joseph is a 22 y.o. G2P0101 at 7572w1d being seen today for ongoing prenatal care.  She is currently monitored for the following issues for this low-risk pregnancy and has Supervision of other normal pregnancy, antepartum; Hx of preterm delivery, currently pregnant; Limited prenatal care; Rh negative state in antepartum period; Headache in pregnancy; Nausea/vomiting in pregnancy; and Post-dates pregnancy on their problem list.  Patient reports no complaints.  Contractions: Irregular. Vag. Bleeding: None.  Movement: (!) Decreased. Denies leaking of fluid.   The following portions of the patient's history were reviewed and updated as appropriate: allergies, current medications, past family history, past medical history, past social history, past surgical history and problem list. Problem list updated.  Objective:   Vitals:   04/12/17 1525  BP: 127/82  Pulse: (!) 105  Weight: 225 lb 1.6 oz (102.1 kg)    Fetal Status: Fetal Heart Rate (bpm): 156 Fundal Height: 41 cm Movement: (!) Decreased  Presentation: Vertex  General:  Alert, oriented and cooperative. Patient is in no acute distress.  Skin: Skin is warm and dry. No rash noted.   Cardiovascular: Normal heart rate noted  Respiratory: Normal respiratory effort, no problems with respiration noted  Abdomen: Soft, gravid, appropriate for gestational age.  Pain/Pressure: Present     Pelvic: Cervical exam performed Dilation: 2 Effacement (%): 60 Station: -2  Extremities: Normal range of motion.  Edema: Trace  Mental Status:  Normal mood and affect. Normal behavior. Normal judgment and thought content.   Assessment and Plan:  Pregnancy: G2P0101 at 1572w1d  1. Post-term pregnancy, 40-42 weeks of gestation NST today shows FHR of 140 bpm with mod variability, 2 acels, no decels. No Contractions.  Patient felt positive fetal movement during NST.  Given that patient reports decreased fetal movements, will  have patient return tomorrow for NST and BPP. Reviewed fetal kick counts with her.  -Induction scheduled for next Thursday, Feb 7 at 11:45 pm.  -Orders placed for induction   2. Supervision of other normal pregnancy, antepartum Reviewed warning signs and fetal kick counts; when to return to MAU.   Term labor symptoms and general obstetric precautions including but not limited to vaginal bleeding, contractions, leaking of fluid and fetal movement were reviewed in detail with the patient. Please refer to After Visit Summary for other counseling recommendations.  Return Patient needs NST BPP on 2-4 or 2-5 as well as on 2-1.   Marylene LandKathryn Lorraine Chinmay Squier, CNM

## 2017-04-12 NOTE — Patient Instructions (Addendum)
Labor Induction Labor induction is when steps are taken to cause a pregnant woman to begin the labor process. Most women go into labor on their own between 37 weeks and 42 weeks of the pregnancy. When this does not happen or when there is a medical need, methods may be used to induce labor. Labor induction causes a pregnant woman's uterus to contract. It also causes the cervix to soften (ripen), open (dilate), and thin out (efface). Usually, labor is not induced before 39 weeks of the pregnancy unless there is a problem with the baby or mother. Before inducing labor, your health care provider will consider a number of factors, including the following:  The medical condition of you and the baby.  How many weeks along you are.  The status of the baby's lung maturity.  The condition of the cervix.  The position of the baby.  What are the reasons for labor induction? Labor may be induced for the following reasons:  The health of the baby or mother is at risk.  The pregnancy is overdue by 1 week or more.  The water breaks but labor does not start on its own.  The mother has a health condition or serious illness, such as high blood pressure, infection, placental abruption, or diabetes.  The amniotic fluid amounts are low around the baby.  The baby is distressed.  Convenience or wanting the baby to be born on a certain date is not a reason for inducing labor. What methods are used for labor induction? Several methods of labor induction may be used, such as:  Prostaglandin medicine. This medicine causes the cervix to dilate and ripen. The medicine will also start contractions. It can be taken by mouth or by inserting a suppository into the vagina.  Inserting a thin tube (catheter) with a balloon on the end into the vagina to dilate the cervix. Once inserted, the balloon is expanded with water, which causes the cervix to open.  Stripping the membranes. Your health care provider separates  amniotic sac tissue from the cervix, causing the cervix to be stretched and causing the release of a hormone called progesterone. This may cause the uterus to contract. It is often done during an office visit. You will be sent home to wait for the contractions to begin. You will then come in for an induction.  Breaking the water. Your health care provider makes a hole in the amniotic sac using a small instrument. Once the amniotic sac breaks, contractions should begin. This may still take hours to see an effect.  Medicine to trigger or strengthen contractions. This medicine is given through an IV access tube inserted into a vein in your arm.  All of the methods of induction, besides stripping the membranes, will be done in the hospital. Induction is done in the hospital so that you and the baby can be carefully monitored. How long does it take for labor to be induced? Some inductions can take up to 2-3 days. Depending on the cervix, it usually takes less time. It takes longer when you are induced early in the pregnancy or if this is your first pregnancy. If a mother is still pregnant and the induction has been going on for 2-3 days, either the mother will be sent home or a cesarean delivery will be needed. What are the risks associated with labor induction? Some of the risks of induction include:  Changes in fetal heart rate, such as too high, too low, or erratic.    Fetal distress.  Chance of infection for the mother and baby.  Increased chance of having a cesarean delivery.  Breaking off (abruption) of the placenta from the uterus (rare).  Uterine rupture (very rare).  When induction is needed for medical reasons, the benefits of induction may outweigh the risks. What are some reasons for not inducing labor? Labor induction should not be done if:  It is shown that your baby does not tolerate labor.  You have had previous surgeries on your uterus, such as a myomectomy or the removal of  fibroids.  Your placenta lies very low in the uterus and blocks the opening of the cervix (placenta previa).  Your baby is not in a head-down position.  The umbilical cord drops down into the birth canal in front of the baby. This could cut off the baby's blood and oxygen supply.  You have had a previous cesarean delivery.  There are unusual circumstances, such as the baby being extremely premature.  This information is not intended to replace advice given to you by your health care provider. Make sure you discuss any questions you have with your health care provider. Document Released: 07/19/2006 Document Revised: 08/05/2015 Document Reviewed: 09/26/2012 Elsevier Interactive Patient Education  2017 [employer] Fetal Movement Counts Patient Name: ________________________________________________ Patient Due Date: ____________________ What is a fetal movement count? A fetal movement count is the number of times that you feel your baby move during a certain amount of time. This may also be called a fetal kick count. A fetal movement count is recommended for every pregnant woman. You may be asked to start counting fetal movements as early as week 28 of your pregnancy. Pay attention to when your baby is most active. You may notice your baby's sleep and wake cycles. You may also notice things that make your baby move more. You should do a fetal movement count:  When your baby is normally most active.  At the same time each day.  A good time to count movements is while you are resting, after having something to eat and drink. How do I count fetal movements? 1. Find a quiet, comfortable area. Sit, or lie down on your side. 2. Write down the date, the start time and stop time, and the number of movements that you felt between those two times. Take this information with you to your health care visits. 3. For 2 hours, count kicks, flutters, swishes, rolls, and jabs. You should feel at least 10  movements during 2 hours. 4. You may stop counting after you have felt 10 movements. 5. If you do not feel 10 movements in 2 hours, have something to eat and drink. Then, keep resting and counting for 1 hour. If you feel at least 4 movements during that hour, you may stop counting. Contact a health care provider if:  You feel fewer than 4 movements in 2 hours.  Your baby is not moving like he or she usually does. Date: ____________ Start time: ____________ Stop time: ____________ Movements: ____________ Date: ____________ Start time: ____________ Stop time: ____________ Movements: ____________ Date: ____________ Start time: ____________ Stop time: ____________ Movements: ____________ Date: ____________ Start time: ____________ Stop time: ____________ Movements: ____________ Date: ____________ Start time: ____________ Stop time: ____________ Movements: ____________ Date: ____________ Start time: ____________ Stop time: ____________ Movements: ____________ Date: ____________ Start time: ____________ Stop time: ____________ Movements: ____________ Date: ____________ Start time: ____________ Stop time: ____________ Movements: ____________ Date: ____________ Start time: ____________ Stop time: ____________ Movements: ____________   This information is not intended to replace advice given to you by your health care provider. Make sure you discuss any questions you have with your health care provider. Document Released: 03/29/2006 Document Revised: 10/27/2015 Document Reviewed: 04/08/2015 Elsevier Interactive Patient Education  2018 [employer] Inc[employer]  

## 2017-04-13 ENCOUNTER — Inpatient Hospital Stay (HOSPITAL_COMMUNITY): Payer: Medicaid Other | Admitting: Anesthesiology

## 2017-04-13 ENCOUNTER — Inpatient Hospital Stay (HOSPITAL_COMMUNITY)
Admission: AD | Admit: 2017-04-13 | Discharge: 2017-04-15 | DRG: 807 | Disposition: A | Discharge status: Home / Self Care | Payer: Medicaid Other | Source: Ambulatory Visit | Attending: Obstetrics and Gynecology | Admitting: Obstetrics and Gynecology

## 2017-04-13 ENCOUNTER — Encounter (HOSPITAL_COMMUNITY): Payer: Self-pay | Admitting: Obstetrics

## 2017-04-13 ENCOUNTER — Ambulatory Visit: Payer: Self-pay

## 2017-04-13 ENCOUNTER — Ambulatory Visit (INDEPENDENT_AMBULATORY_CARE_PROVIDER_SITE_OTHER): Payer: Medicaid Other | Admitting: *Deleted

## 2017-04-13 DIAGNOSIS — O368130 Decreased fetal movements, third trimester, not applicable or unspecified: Secondary | ICD-10-CM

## 2017-04-13 DIAGNOSIS — Z3A40 40 weeks gestation of pregnancy: Secondary | ICD-10-CM | POA: Diagnosis not present

## 2017-04-13 DIAGNOSIS — O9952 Diseases of the respiratory system complicating childbirth: Secondary | ICD-10-CM | POA: Diagnosis present

## 2017-04-13 DIAGNOSIS — O26893 Other specified pregnancy related conditions, third trimester: Secondary | ICD-10-CM | POA: Diagnosis present

## 2017-04-13 DIAGNOSIS — Z6791 Unspecified blood type, Rh negative: Secondary | ICD-10-CM

## 2017-04-13 DIAGNOSIS — R51 Headache: Secondary | ICD-10-CM

## 2017-04-13 DIAGNOSIS — O480 Post-term pregnancy: Secondary | ICD-10-CM

## 2017-04-13 DIAGNOSIS — J45909 Unspecified asthma, uncomplicated: Secondary | ICD-10-CM | POA: Diagnosis present

## 2017-04-13 DIAGNOSIS — Z87891 Personal history of nicotine dependence: Secondary | ICD-10-CM | POA: Diagnosis not present

## 2017-04-13 DIAGNOSIS — O219 Vomiting of pregnancy, unspecified: Secondary | ICD-10-CM

## 2017-04-13 DIAGNOSIS — Z3480 Encounter for supervision of other normal pregnancy, unspecified trimester: Secondary | ICD-10-CM

## 2017-04-13 LAB — CBC
HEMATOCRIT: 39.4 % (ref 36.0–46.0)
HEMOGLOBIN: 13.1 g/dL (ref 12.0–15.0)
MCH: 27.5 pg (ref 26.0–34.0)
MCHC: 33.2 g/dL (ref 30.0–36.0)
MCV: 82.6 fL (ref 78.0–100.0)
Platelets: 205 10*3/uL (ref 150–400)
RBC: 4.77 MIL/uL (ref 3.87–5.11)
RDW: 13.4 % (ref 11.5–15.5)
WBC: 7.1 10*3/uL (ref 4.0–10.5)

## 2017-04-13 LAB — TYPE AND SCREEN
ABO/RH(D): B NEG
ANTIBODY SCREEN: NEGATIVE

## 2017-04-13 LAB — ABO/RH: ABO/RH(D): B NEG

## 2017-04-13 MED ORDER — LIDOCAINE HCL (PF) 1 % IJ SOLN
INTRAMUSCULAR | Status: DC | PRN
  Administered 2017-04-13 (×2): 5 mL

## 2017-04-13 MED ORDER — OXYCODONE-ACETAMINOPHEN 5-325 MG PO TABS: 2.0000 | ORAL_TABLET | ORAL | Status: DC | PRN

## 2017-04-13 MED ORDER — FENTANYL 2.5 MCG/ML BUPIVACAINE 1/10 % EPIDURAL INFUSION (WH - ANES)
14.0000 mL/h | INTRAMUSCULAR | Status: DC | PRN
  Administered 2017-04-13: 14 mL/h via EPIDURAL
  Filled 2017-04-13: qty 100

## 2017-04-13 MED ORDER — LACTATED RINGERS IV SOLN
500.0000 mL | Freq: Once | INTRAVENOUS | Status: AC
  Administered 2017-04-13: 500 mL via INTRAVENOUS

## 2017-04-13 MED ORDER — ACETAMINOPHEN 325 MG PO TABS
650.0000 mg | ORAL_TABLET | ORAL | Status: DC | PRN
  Administered 2017-04-13: 650 mg via ORAL
  Filled 2017-04-13: qty 2

## 2017-04-13 MED ORDER — OXYTOCIN BOLUS FROM INFUSION
500.0000 mL | Freq: Once | INTRAVENOUS | Status: AC
  Administered 2017-04-14: 500 mL via INTRAVENOUS

## 2017-04-13 MED ORDER — ONDANSETRON HCL 4 MG/2ML IJ SOLN: 4.0000 mg | Freq: Four times a day (QID) | INTRAMUSCULAR | Status: DC | PRN

## 2017-04-13 MED ORDER — PHENYLEPHRINE 40 MCG/ML (10ML) SYRINGE FOR IV PUSH (FOR BLOOD PRESSURE SUPPORT)
80.0000 ug | PREFILLED_SYRINGE | INTRAVENOUS | Status: DC | PRN
  Filled 2017-04-13: qty 5

## 2017-04-13 MED ORDER — SOD CITRATE-CITRIC ACID 500-334 MG/5ML PO SOLN: 30.0000 mL | ORAL | Status: DC | PRN

## 2017-04-13 MED ORDER — OXYTOCIN 40 UNITS IN LACTATED RINGERS INFUSION - SIMPLE MED
1.0000 m[IU]/min | INTRAVENOUS | Status: DC
  Administered 2017-04-13: 2 m[IU]/min via INTRAVENOUS
  Filled 2017-04-13: qty 1000

## 2017-04-13 MED ORDER — EPHEDRINE 5 MG/ML INJ
10.0000 mg | INTRAVENOUS | Status: DC | PRN
  Filled 2017-04-13: qty 2

## 2017-04-13 MED ORDER — PHENYLEPHRINE 40 MCG/ML (10ML) SYRINGE FOR IV PUSH (FOR BLOOD PRESSURE SUPPORT)
80.0000 ug | PREFILLED_SYRINGE | INTRAVENOUS | Status: DC | PRN
  Filled 2017-04-13: qty 10
  Filled 2017-04-13: qty 5

## 2017-04-13 MED ORDER — OXYTOCIN 40 UNITS IN LACTATED RINGERS INFUSION - SIMPLE MED: 2.5000 [IU]/h | INTRAVENOUS | Status: DC

## 2017-04-13 MED ORDER — LIDOCAINE HCL (PF) 1 % IJ SOLN
30.0000 mL | INTRAMUSCULAR | Status: DC | PRN
  Filled 2017-04-13: qty 30

## 2017-04-13 MED ORDER — LACTATED RINGERS IV SOLN
500.0000 mL | INTRAVENOUS | Status: DC | PRN
  Administered 2017-04-13: 500 mL via INTRAVENOUS

## 2017-04-13 MED ORDER — LACTATED RINGERS IV SOLN
INTRAVENOUS | Status: DC
  Administered 2017-04-13 (×2): via INTRAVENOUS

## 2017-04-13 MED ORDER — MISOPROSTOL 25 MCG QUARTER TABLET
25.0000 ug | ORAL_TABLET | ORAL | Status: DC | PRN
  Filled 2017-04-13: qty 1

## 2017-04-13 MED ORDER — TERBUTALINE SULFATE 1 MG/ML IJ SOLN
0.2500 mg | Freq: Once | INTRAMUSCULAR | Status: DC | PRN
  Filled 2017-04-13: qty 1

## 2017-04-13 MED ORDER — OXYCODONE-ACETAMINOPHEN 5-325 MG PO TABS
1.0000 | ORAL_TABLET | ORAL | Status: DC | PRN
  Administered 2017-04-14 – 2017-04-15 (×3): 1 via ORAL
  Filled 2017-04-13 (×3): qty 1

## 2017-04-13 MED ORDER — DIPHENHYDRAMINE HCL 50 MG/ML IJ SOLN: 12.5000 mg | INTRAMUSCULAR | Status: DC | PRN

## 2017-04-13 MED ORDER — TERBUTALINE SULFATE 1 MG/ML IJ SOLN: 0.2500 mg | Freq: Once | INTRAMUSCULAR | Status: DC | PRN

## 2017-04-13 MED ORDER — FENTANYL CITRATE (PF) 100 MCG/2ML IJ SOLN
100.0000 ug | INTRAMUSCULAR | Status: DC | PRN
  Administered 2017-04-13: 100 ug via INTRAVENOUS
  Filled 2017-04-13: qty 2

## 2017-04-13 NOTE — Progress Notes (Addendum)
Labor Progress Note Tonya Joseph is a 22 y.o. G2P0101 at 4873w2d presented for IOL for poor fetal movement S: no complaints  O:  BP 126/67   Pulse 85   Temp 97.7 F (36.5 C) (Oral)   Resp 18   Ht 5\' 6"  (1.676 m)   Wt 102.1 kg (225 lb)   LMP 05/24/2016 (LMP Unknown)   SpO2 100%   BMI 36.32 kg/m  EFM: 135/mod var/10x10accels, isolated mild late decels  CVE: Dilation: 7 Effacement (%): 60 Cervical Position: Middle Station: -1, 0 Presentation: Vertex Exam by:: Tonya Joseph(verified by Philipp DeputyKim Shaw)   A&P: 22 y.o. [id number] 3073w2d  IOL for poor fetal movement #Labor: Progressing well. AROM at 2345. Continue to titrate pitocin #Pain: epidural #FWB: reactive NST #GBS negative  Alroy BailiffParker W Janiylah Hannis, MD 11:38 PM

## 2017-04-13 NOTE — Progress Notes (Signed)
Pt reported decreased FM during prenatal visit yesterday - she felt good FM last evening @ home however none yet this morning.   Pt informed that the ultrasound is considered a limited OB ultrasound and is not intended to be a complete ultrasound exam.  Patient also informed that the ultrasound is not being completed with the intent of assessing for fetal or placental anomalies or any pelvic abnormalities.  Explained that the purpose of today's ultrasound is to assess for presentation, BPP and amniotic fluid volume.  Patient acknowledges the purpose of the exam and the limitations of the study.    Consult w/Dr. Earlene Plateravis - pt for direct admit and IOL today.

## 2017-04-13 NOTE — Progress Notes (Signed)
Tonya Joseph is a 22 y.o. G2P0101 at 852w2d by ultrasound admitted for induction of labor due to poor fetal movement.  Subjective: Patient doing well. Would like to walk. States she still does not want pain medicine but "wants it soon".   Objective: BP 99/75   Pulse 88   Temp 98.7 F (37.1 C) (Oral)   Resp 18   Ht 5\' 6"  (1.676 m)   Wt 102.1 kg (225 lb)   LMP 05/24/2016 (LMP Unknown)   BMI 36.32 kg/m  No intake/output data recorded. No intake/output data recorded.  FHT:  FHR: 140 bpm, variability: moderate,  accelerations:  Present,  decelerations:  Absent (earlier had late decels that resolved with positional change) UC:   irregular SVE:   Dilation: 2.5 Effacement (%): 70, 80 Station: -2 Exam by:: stone rnc  Labs: Lab Results  Component Value Date   WBC 7.1 04/13/2017   HGB 13.1 04/13/2017   HCT 39.4 04/13/2017   MCV 82.6 04/13/2017   PLT 205 04/13/2017    Assessment / Plan: Induction of labor due to poor fetal movement,  progressing well on pitocin  Labor: Progressing on Pitocin, will continue to increase then AROM Fetal Wellbeing:  Category I Pain Control:  per patient request I/D:  n/a Anticipated MOD:  NSVD  Gleen Ripberger 04/13/2017, 6:18 PM

## 2017-04-13 NOTE — H&P (Signed)
LABOR AND DELIVERY ADMISSION HISTORY AND PHYSICAL NOTE  Tonya Joseph is a 22 y.o. female G47P0101 with IUP at [redacted]w[redacted]d by early ultraround presenting for IOL for poor fetal movement and BPP 8/10.  She reports minimal fetal movement. She denies leakage of fluid or vaginal bleeding.  Prenatal History/Complications: PNC at [facility] Pregnancy complications:  - limited prenatal care - hx of preterm labor  - Rh negative state in antepartum (s/p rhogam)  Sono: @[redacted]w[redacted]d , CWD, normal anatomy, cephalic presentation, placenta anterior, above cervical os, 2538g, 54% EFW  Past Medical History: Past Medical History:  Diagnosis Date  . Anxiety   . Asthma   . Hypertension   . Infection    UTI    Past Surgical History: Past Surgical History:  Procedure Laterality Date  . NO PAST SURGERIES      Obstetrical History: OB History    Gravida Para Term Preterm AB Living   2 1 0 1 0 1   SAB TAB Ectopic Multiple Live Births           1      Social History: Social History   Socioeconomic History  . Marital status: Single    Spouse name: None  . Number of children: None  . Years of education: None  . Highest education level: None  Social Needs  . Financial resource strain: None  . Food insecurity - worry: None  . Food insecurity - inability: None  . Transportation needs - medical: None  . Transportation needs - non-medical: None  Occupational History  . None  Tobacco Use  . Smoking status: Former Games developer  . Smokeless tobacco: Never Used  . Tobacco comment: quit afteer asthma dx  Substance and Sexual Activity  . Alcohol use: No    Frequency: Never  . Drug use: No  . Sexual activity: Yes    Birth control/protection: None  Other Topics Concern  . None  Social History Narrative  . None    Family History: Family History  Problem Relation Age of Onset  . Diabetes Maternal Grandmother   . Hypertension Paternal Grandmother     Allergies: No Known Allergies  Medications Prior to  Admission  Medication Sig Dispense Refill Last Dose  . Prenatal Vit-Fe Fumarate-FA (PRENATAL MULTIVITAMIN) TABS tablet Take 1 tablet daily at 12 noon by mouth.   04/13/2017 at Unknown time  . albuterol (PROVENTIL HFA;VENTOLIN HFA) 108 (90 Base) MCG/ACT inhaler Inhale 2 puffs every 6 (six) hours as needed into the lungs for wheezing or shortness of breath. 1 Inhaler 2 Taking     Review of Systems  All systems reviewed and negative except as stated in HPI  Physical Exam Blood pressure 138/79, pulse 92, temperature 98 F (36.7 C), temperature source Oral, resp. rate 18, height 5\' 6"  (1.676 m), weight 102.1 kg (225 lb), last menstrual period 05/24/2016. General appearance: alert, oriented, NAD Lungs: normal respiratory effort Heart: regular rate Abdomen: soft, non-tender; gravid, FH appropriate for GA Extremities: No calf swelling or tenderness Presentation: cephalic Fetal monitoring: FHR 140, moderate variability, +accel, -decel  Uterine activity: irregular  Dilation: 2 Effacement (%): 70 Station: -2 Exam by:: stone rnc  Prenatal labs: ABO, Rh: --/--/B NEG (02/01 1258) Antibody: NEG (02/01 1258) Rubella: 4.73 (11/27 1046) RPR: Non Reactive (11/27 1046)  HBsAg: Negative (11/27 1046)  HIV: Non Reactive (11/27 1046)  GC/Chlamydia: Negative 03/14/2017 GBS: Negative (01/02 0000)  1-hr GTT: 79 Genetic screening:  Too late Anatomy US: normal   Prenatal Transfer Tool  Maternal Diabetes: No Genetic Screening: too late Maternal Ultrasounds/Referrals: Normal Fetal Ultrasounds or other Referrals:  None Maternal Substance Abuse:  No Significant Maternal Medications:  None Significant Maternal Lab Results: Lab values include: Group B Strep negative, Rh negative  Results for orders placed or performed during the hospital encounter of 04/13/17 (from the past 24 hour(s))  CBC   Collection Time: 04/13/17 12:58 PM  Result Value Ref Range   WBC 7.1 4.0 - 10.5 K/uL   RBC 4.77 3.87 - 5.11  MIL/uL   Hemoglobin 13.1 12.0 - 15.0 g/dL   HCT 19.139.4 47.836.0 - 29.546.0 %   MCV 82.6 78.0 - 100.0 fL   MCH 27.5 26.0 - 34.0 pg   MCHC 33.2 30.0 - 36.0 g/dL   RDW 62.113.4 30.811.5 - 65.715.5 %   Platelets 205 150 - 400 K/uL  Type and screen   Collection Time: 04/13/17 12:58 PM  Result Value Ref Range   ABO/RH(D) B NEG    Antibody Screen NEG    Sample Expiration      04/16/2017 Performed at [facility], [address] [place], [place] [id number]     Patient Active Problem List   Diagnosis Date Noted  . Indication for care in labor and delivery, antepartum 04/13/2017  . Post-dates pregnancy 04/12/2017  . Headache in pregnancy 04/09/2017  . Nausea/vomiting in pregnancy 04/09/2017  . Rh negative state in antepartum period 02/21/2017  . Supervision of other normal pregnancy, antepartum 02/06/2017  . Hx of preterm delivery, currently pregnant 02/06/2017  . Limited prenatal care 02/06/2017    Assessment: Tonya Joseph is a 22 y.o. G2P0101 at 1312w2d here for IOL for non-reassuring fetal tracing with BPP 8/10  #Labor: IOL with pitocin. Will titrate up as needed  #Pain: Per patient request, IV pain meds and epidural when further in labor  #FWB: Category 1  #ID:  GBS neg  #MOF: breast  #MOC:IUD  #Circ:  Outpatient   Tonya Joseph 04/13/2017, 2:33 PM

## 2017-04-13 NOTE — Anesthesia Procedure Notes (Signed)
Epidural Patient location during procedure: OB  Staffing Anesthesiologist: Kylie Gros, MD Performed: anesthesiologist   Preanesthetic Checklist Completed: patient identified, site marked, surgical consent, pre-op evaluation, timeout performed, IV checked, risks and benefits discussed and monitors and equipment checked  Epidural Patient position: sitting Prep: DuraPrep Patient monitoring: heart rate, continuous pulse ox and blood pressure Approach: right paramedian Location: L3-L4 Injection technique: LOR saline  Needle:  Needle type: Tuohy  Needle gauge: 17 G Needle length: 9 cm and 9 Needle insertion depth: 7 cm Catheter type: closed end flexible Catheter size: 20 Guage Catheter at skin depth: 12 cm Test dose: negative  Assessment Events: blood not aspirated, injection not painful, no injection resistance, negative IV test and no paresthesia  Additional Notes Patient identified. Risks/Benefits/Options discussed with patient including but not limited to bleeding, infection, nerve damage, paralysis, failed block, incomplete pain control, headache, blood pressure changes, nausea, vomiting, reactions to medication both or allergic, itching and postpartum back pain. Confirmed with bedside nurse the patient's most recent platelet count. Confirmed with patient that they are not currently taking any anticoagulation, have any bleeding history or any family history of bleeding disorders. Patient expressed understanding and wished to proceed. All questions were answered. Sterile technique was used throughout the entire procedure. Please see nursing notes for vital signs. Test dose was given through epidural needle and negative prior to continuing to dose epidural or start infusion. Warning signs of high block given to the patient including shortness of breath, tingling/numbness in hands, complete motor block, or any concerning symptoms with instructions to call for help. Patient was given  instructions on fall risk and not to get out of bed. All questions and concerns addressed with instructions to call with any issues.     

## 2017-04-13 NOTE — Progress Notes (Signed)
I have reviewed this chart and agree with the RN/CMA assessment and management.    K. Meryl Davis, M.D. Attending Obstetrician & Gynecologist, [facility], [facility]  

## 2017-04-13 NOTE — Anesthesia Preprocedure Evaluation (Signed)
Anesthesia Evaluation  Patient identified by MRN, date of birth, ID band Patient awake    Reviewed: Allergy & Precautions, H&P , NPO status , Patient's Chart, lab work & pertinent test results  History of Anesthesia Complications Negative for: history of anesthetic complications  Airway Mallampati: II  TM Distance: >3 FB Neck ROM: full    Dental no notable dental hx. (+) Teeth Intact   Pulmonary neg pulmonary ROS, former smoker,    Pulmonary exam normal breath sounds clear to auscultation       Cardiovascular hypertension, negative cardio ROS Normal cardiovascular exam Rhythm:regular Rate:Normal     Neuro/Psych negative neurological ROS  negative psych ROS   GI/Hepatic negative GI ROS, Neg liver ROS,   Endo/Other  negative endocrine ROS  Renal/GU negative Renal ROS  negative genitourinary   Musculoskeletal   Abdominal   Peds  Hematology negative hematology ROS (+)   Anesthesia Other Findings   Reproductive/Obstetrics (+) Pregnancy                             Anesthesia Physical Anesthesia Plan  ASA: II  Anesthesia Plan: Epidural   Post-op Pain Management:    Induction:   PONV Risk Score and Plan:   Airway Management Planned:   Additional Equipment:   Intra-op Plan:   Post-operative Plan:   Informed Consent: I have reviewed the patients History and Physical, chart, labs and discussed the procedure including the risks, benefits and alternatives for the proposed anesthesia with the patient or authorized representative who has indicated his/her understanding and acceptance.     Plan Discussed with:   Anesthesia Plan Comments:         Anesthesia Quick Evaluation  

## 2017-04-14 ENCOUNTER — Encounter (HOSPITAL_COMMUNITY): Payer: Self-pay

## 2017-04-14 DIAGNOSIS — Z3A40 40 weeks gestation of pregnancy: Secondary | ICD-10-CM

## 2017-04-14 DIAGNOSIS — O480 Post-term pregnancy: Secondary | ICD-10-CM

## 2017-04-14 LAB — RPR: RPR Ser Ql: NONREACTIVE

## 2017-04-14 MED ORDER — BENZOCAINE-MENTHOL 20-0.5 % EX AERO
1.0000 "application " | INHALATION_SPRAY | CUTANEOUS | Status: DC | PRN
  Administered 2017-04-14: 1 via TOPICAL
  Filled 2017-04-14: qty 56

## 2017-04-14 MED ORDER — ZOLPIDEM TARTRATE 5 MG PO TABS: 5.0000 mg | ORAL_TABLET | Freq: Every evening | ORAL | Status: DC | PRN

## 2017-04-14 MED ORDER — PRENATAL MULTIVITAMIN CH
1.0000 | ORAL_TABLET | Freq: Every day | ORAL | Status: DC
  Administered 2017-04-14 – 2017-04-15 (×2): 1 via ORAL
  Filled 2017-04-14 (×2): qty 1

## 2017-04-14 MED ORDER — PNEUMOCOCCAL VAC POLYVALENT 25 MCG/0.5ML IJ INJ
0.5000 mL | INJECTION | INTRAMUSCULAR | Status: DC
  Filled 2017-04-14 (×2): qty 0.5

## 2017-04-14 MED ORDER — TETANUS-DIPHTH-ACELL PERTUSSIS 5-2.5-18.5 LF-MCG/0.5 IM SUSP: 0.5000 mL | Freq: Once | INTRAMUSCULAR | Status: DC

## 2017-04-14 MED ORDER — WITCH HAZEL-GLYCERIN EX PADS: 1.0000 "application " | MEDICATED_PAD | CUTANEOUS | Status: DC | PRN

## 2017-04-14 MED ORDER — ONDANSETRON HCL 4 MG PO TABS
4.0000 mg | ORAL_TABLET | ORAL | Status: DC | PRN
Start: 2017-04-14 — End: 2017-04-15

## 2017-04-14 MED ORDER — ACETAMINOPHEN 325 MG PO TABS
650.0000 mg | ORAL_TABLET | ORAL | Status: DC | PRN
  Administered 2017-04-14: 650 mg via ORAL
  Filled 2017-04-14: qty 2

## 2017-04-14 MED ORDER — DIBUCAINE 1 % RE OINT: 1.0000 "application " | TOPICAL_OINTMENT | RECTAL | Status: DC | PRN

## 2017-04-14 MED ORDER — COCONUT OIL OIL: 1.0000 "application " | TOPICAL_OIL | Status: DC | PRN

## 2017-04-14 MED ORDER — SENNOSIDES-DOCUSATE SODIUM 8.6-50 MG PO TABS
2.0000 | ORAL_TABLET | ORAL | Status: DC
  Administered 2017-04-14: 2 via ORAL
  Filled 2017-04-14: qty 2

## 2017-04-14 MED ORDER — DIPHENHYDRAMINE HCL 25 MG PO CAPS: 25.0000 mg | ORAL_CAPSULE | Freq: Four times a day (QID) | ORAL | Status: DC | PRN

## 2017-04-14 MED ORDER — IBUPROFEN 600 MG PO TABS
600.0000 mg | ORAL_TABLET | Freq: Four times a day (QID) | ORAL | Status: DC
  Administered 2017-04-14 – 2017-04-15 (×6): 600 mg via ORAL
  Filled 2017-04-14 (×6): qty 1

## 2017-04-14 MED ORDER — SIMETHICONE 80 MG PO CHEW: 80.0000 mg | CHEWABLE_TABLET | ORAL | Status: DC | PRN

## 2017-04-14 MED ORDER — ONDANSETRON HCL 4 MG/2ML IJ SOLN: 4.0000 mg | INTRAMUSCULAR | Status: DC | PRN

## 2017-04-14 NOTE — Anesthesia Postprocedure Evaluation (Signed)
Anesthesia Post Note  Patient: Tonya Joseph  Procedure(s) Performed: AN AD HOC LABOR EPIDURAL     Patient location during evaluation: Mother Baby Anesthesia Type: Epidural Level of consciousness: awake Pain management: satisfactory to patient Vital Signs Assessment: post-procedure vital signs reviewed and stable Respiratory status: spontaneous breathing Cardiovascular status: stable Anesthetic complications: no    Last Vitals:  Vitals:   04/14/17 0348 04/14/17 0448  BP: (!) 104/54 (!) 104/45  Pulse: 81 92  Resp: 18 18  Temp: 36.8 C 36.7 C  SpO2:      Last Pain:  Vitals:   04/14/17 0623  TempSrc:   PainSc: 6    Pain Goal:                 Cephus ShellingBURGER,Joab Carden

## 2017-04-14 NOTE — Lactation Note (Signed)
This note was copied from a baby's chart. Lactation Consultation Note  Patient Name: Tonya Joseph ZOXWR'UToday's Date: 04/14/2017 Reason for consult: Initial assessment;Term  Initial assessment with Exp BF mom of 18 hour old infant. Infant with 4 BF for 10-45 minutes, 5 BF attempts, 2 voids and 3 stools since birth. LATCH scores 8.   Mom was attempting to latch infant and he was a little sleepy. Enc mom to feed him STS. Mom undressed infant and latched him to the right breast in the cross cradle hold. Reminded mom of the difference in positioning and holding an infant vs a toddler. Enc mom to use good head and pillow support with feeding. Added pillows for good support. Infant with flanged lips and rhythmic suckles, infant still feeding when LC left room.   Enc mom to feed infant STS 8-12 x in 24 hours at first feeding cues. Reviewed hand expression, colostrum, milk coming to volume and cluster feeding. Showed mom awakening techniques and breast massage/compression with feeding. Mom able to hand express colostrum.   BF Resources handout and [facility] given, mom informed of IP/OP Services, BF Support Groups and LC phone #. [facility], she is aware to call and make appt post d/c.   Mom reports she has no further questions/concerns at this time. Mom to call out for assistance as needed.   Maternal Data Formula Feeding for Exclusion: No Has patient been taught Hand Expression?: Yes Does the patient have breastfeeding experience prior to this delivery?: Yes  Feeding Feeding Type: Breast Fed Length of feed: 10 min(infant still feeding when LC left room)  LATCH Score Latch: Grasps breast easily, tongue down, lips flanged, rhythmical sucking.  Audible Swallowing: A few with stimulation  Type of Nipple: Everted at rest and after stimulation  Comfort (Breast/Nipple): Soft / non-tender  Hold (Positioning): Assistance needed to correctly position infant at breast and  maintain latch.  LATCH Score: 8  Interventions Interventions: Breast feeding basics reviewed;Support pillows;Assisted with latch;Position options;Skin to skin;Expressed milk;Breast massage;Breast compression;Hand express  Lactation Tools Discussed/Used WIC Program: No([facility]ans to apply[facility])   Consult Status Consult Status: Follow-up Date: 04/15/17 Follow-up type: In-patient    Silas FloodSharon S Nolen Lindamood 04/14/2017, 7:41 PM

## 2017-04-15 MED ORDER — SENNOSIDES-DOCUSATE SODIUM 8.6-50 MG PO TABS: 2.0000 | ORAL_TABLET | ORAL | 0 refills | Status: AC

## 2017-04-15 MED ORDER — IBUPROFEN 600 MG PO TABS: 600.0000 mg | ORAL_TABLET | Freq: Four times a day (QID) | ORAL | 0 refills | Status: AC

## 2017-04-15 NOTE — Discharge Instructions (Signed)

## 2017-04-15 NOTE — Clinical Social Work Maternal (Signed)
  CLINICAL SOCIAL WORK MATERNAL/CHILD NOTE  Patient Details  Name: Tonya Joseph MRN: [id number] Date of Birth: 1995-09-19  Date:  04/15/2017  Clinical Social Worker Initiating Note:  Missy Baksh lcsw Date/Time: Initiated:  04/15/17/      Child's Name:  IOXBD   Biological Parents:  Mother, Father   Need for Interpreter:  None   Reason for Referral:  Other (Comment)(Edinburgh score of 10)   Address:  [place] [address]    Phone number:  [phone] (home)     Additional phone number:   Household Members/Support Persons (HM/SP):   Household Member/Support Person 1   HM/SP Name Relationship DOB or Age  HM/SP -1 [place] significant other    HM/SP -2        HM/SP -3        HM/SP -4        HM/SP -5        HM/SP -6        HM/SP -7        HM/SP -8          Natural Supports (not living in the home):  Extended Family, Immediate Family   Professional Supports: None   Employment: Unemployed   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   Education:      Homebound arranged:    Museum/gallery curator Resources:      Other Resources:  Physicist, medical , [facility]   Cultural/Religious Considerations Which May Impact Care:    Strengths:  Ability to meet basic needs , Home prepared for child    Psychotropic Medications:         Pediatrician:       Pediatrician List:   [facility]      Pediatrician Fax Number:    Risk Factors/Current Problems:  None   Cognitive State:  Able to Concentrate , Alert    Mood/Affect:  Calm (tired stated she took percocet)   CSW Assessment:  LCSW consulted for MOB who had a score of 10 on her [place].  LCSW met with MOB and FOB at bedside to assess for services.  MOB reported that she had some sadness and lethargy wiith her first baby girl who will be 22 years old in March.  MOB reported that she did not take medication for this  stating she got better over time.  MOB reported that she lived with FOB, his father and her two year old daughter.  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  LCSW provided education on post partem depression and provided MOB with a check list for her to assess her symptoms over the next few weeks.  LCSW recommended that MOB follow up with MD should she have any of the symptoms on her checklist.  MOB reported that she was "good but tired" stating that they have given her percocet and she was also up all night.  LCSW provided information on support groups offered through [facility].  RN reported no concerns.  No referrals at this time.  CSW Plan/Description:  No Further Intervention Required/No Barriers to Discharge    Carlean Jews, LCSW 04/15/2017, 10:52 AM

## 2017-04-15 NOTE — Lactation Note (Signed)
This note was copied from a baby's chart. Lactation Consultation Note  Patient Name: Tonya Joseph Reason for consult: Follow-up assessment  P2, Ex Bf 19 mos. Baby 33 hours old and cueing. Mother latched baby easily with sucks and swallows observed. Mom encouraged to feed baby 8-12 times/24 hours and with feeding cues.  Reviewed engorgement care and monitoring voids/stools. No concerns or questions.   Maternal Data    Feeding Feeding Type: Breast Fed Length of feed: 25 min  LATCH Score Latch: Grasps breast easily, tongue down, lips flanged, rhythmical sucking.  Audible Swallowing: A few with stimulation  Type of Nipple: Everted at rest and after stimulation  Comfort (Breast/Nipple): Soft / non-tender  Hold (Positioning): No assistance needed to correctly position infant at breast.  LATCH Score: 9  Interventions Interventions: Breast feeding basics reviewed  Lactation Tools Discussed/Used     Consult Status Consult Status: Complete    Hardie PulleyBerkelhammer, Ruth Boschen Joseph, 10:52 AM

## 2017-04-15 NOTE — Discharge Summary (Signed)
OB Discharge Summary     Patient Name: Tonya Joseph DOB: [birth year] MRN: [id number]  Date of admission: 04/13/2017 Delivering MD: Alroy BailiffLELAND, PARKER W   Date of discharge: 04/15/2017  Admitting diagnosis: INDUCTION Intrauterine pregnancy: 337w3d     Secondary diagnosis:  Active Problems:   Indication for care in labor and delivery, antepartum  Additional problems: limited PNC, h/o preterm delivery, Rh neg state in antepartum      Discharge diagnosis: Term Pregnancy Delivered                                                                                                Post partum procedures:none  Augmentation: AROM and Pitocin  Complications: None  Hospital course:  Induction of Labor With Vaginal Delivery   22 y.o. yo [id number] at 577w3d was admitted to the hospital 04/13/2017 for induction of labor.  Indication for induction: non reassuring fetal tracing.  Patient had an uncomplicated labor course as follows: Membrane Rupture Time/Date: 11:54 PM ,04/13/2017   Intrapartum Procedures: Episiotomy: None [1]                                         Lacerations:  None [1]  Patient had delivery of a Viable infant.  Information for the patient's newborn:  Tonya Joseph, Boy Tonya Joseph [[id number]][[id number]]  Delivery Method: Vaginal, Spontaneous(Filed from Delivery Summary)   04/14/2017  Details of delivery can be found in separate delivery note.  Patient had a routine postpartum course. Patient is discharged home 04/15/17.  Physical exam  Vitals:   04/14/17 0448 04/14/17 0815 04/14/17 1655 04/15/17 0704  BP: (!) 104/45 (!) 112/59 120/80 122/68  Pulse: 92 96 92 (!) 105  Resp: 18 16 16 18   Temp: 98 F (36.7 C) 98.4 F (36.9 C) 98.4 F (36.9 C) 97.9 F (36.6 C)  TempSrc: Oral Oral Oral Oral  SpO2:  98% 100%   Weight:      Height:       General: alert, cooperative and no distress Lochia: appropriate Uterine Fundus: firm Incision: N/A DVT Evaluation: No evidence of DVT seen on physical exam. No cords  or calf tenderness. No significant calf/ankle edema. Labs: Lab Results  Component Value Date   WBC 7.1 04/13/2017   HGB 13.1 04/13/2017   HCT 39.4 04/13/2017   MCV 82.6 04/13/2017   PLT 205 04/13/2017   CMP Latest Ref Rng & Units 04/09/2017  Glucose 65 - 99 mg/dL 97  BUN 6 - 20 mg/dL 7  Creatinine 6.960.44 - 2.951.00 mg/dL 2.840.59  Sodium 132135 - 440145 mmol/L 134(L)  Potassium 3.5 - 5.1 mmol/L 3.8  Chloride 101 - 111 mmol/L 106  CO2 22 - 32 mmol/L 19(L)  Calcium 8.9 - 10.3 mg/dL 8.9  Total Protein 6.5 - 8.1 g/dL 7.9  Total Bilirubin 0.3 - 1.2 mg/dL 0.4  Alkaline Phos 38 - 126 U/L 95  AST 15 - 41 U/L 17  ALT 14 - 54 U/L 11(L)    Discharge instruction: per After Visit Summary  and "Baby and Me Booklet".  After visit meds:  Allergies as of 04/15/2017   No Known Allergies     Medication List    TAKE these medications   albuterol 108 (90 Base) MCG/ACT inhaler Commonly known as:  PROVENTIL HFA;VENTOLIN HFA Inhale 2 puffs every 6 (six) hours as needed into the lungs for wheezing or shortness of breath.   ibuprofen 600 MG tablet Commonly known as:  ADVIL,MOTRIN Take 1 tablet (600 mg total) by mouth every 6 (six) hours.   prenatal multivitamin Tabs tablet Take 1 tablet daily at 12 noon by mouth.   senna-docusate 8.6-50 MG tablet Commonly known as:  Senokot-S Take 2 tablets by mouth daily. Start taking on:  04/16/2017       Diet: routine diet  Activity: Advance as tolerated. Pelvic rest for 6 weeks.   Outpatient follow up:4 weeks Follow up Appt:No future appointments. Follow up Visit:No Follow-up on file. Follow-up Information    [facility]. Schedule an appointment as soon as possible for a visit in 4 day(s).   Why:  Follow up in 4 weeks for post partum check Contact information: [address] [place] [address] [phone]          Postpartum contraception: IUD Paragard  Newborn Data: Live born female  Birth  Weight: 8 lb 1.8 oz (3680 g) APGAR: 8, 9  Newborn Delivery   Birth date/time:  04/14/2017 01:10:00 Delivery type:  Vaginal, Spontaneous     Baby Feeding: Breast Disposition:home with mother   04/15/2017 Tonya Manis, DO

## 2017-04-16 ENCOUNTER — Other Ambulatory Visit: Payer: Self-pay

## 2017-04-17 ENCOUNTER — Encounter: Payer: Self-pay | Admitting: *Deleted

## 2017-04-20 ENCOUNTER — Inpatient Hospital Stay (HOSPITAL_COMMUNITY): Admission: RE | Admit: 2017-04-20 | Payer: Self-pay | Source: Ambulatory Visit

## 2017-05-18 ENCOUNTER — Ambulatory Visit: Payer: Self-pay | Admitting: Medical

## 2017-05-18 ENCOUNTER — Encounter (HOSPITAL_COMMUNITY): Payer: Self-pay

## 2017-05-18 ENCOUNTER — Other Ambulatory Visit: Payer: Self-pay

## 2017-05-18 ENCOUNTER — Inpatient Hospital Stay (HOSPITAL_COMMUNITY)
Admission: AD | Admit: 2017-05-18 | Discharge: 2017-05-18 | Disposition: A | Discharge status: Home / Self Care | Payer: Medicaid Other | Source: Ambulatory Visit | Attending: Obstetrics and Gynecology | Admitting: Obstetrics and Gynecology

## 2017-05-18 DIAGNOSIS — Z833 Family history of diabetes mellitus: Secondary | ICD-10-CM | POA: Insufficient documentation

## 2017-05-18 DIAGNOSIS — Z87891 Personal history of nicotine dependence: Secondary | ICD-10-CM | POA: Diagnosis not present

## 2017-05-18 DIAGNOSIS — Z8249 Family history of ischemic heart disease and other diseases of the circulatory system: Secondary | ICD-10-CM | POA: Diagnosis not present

## 2017-05-18 DIAGNOSIS — Z79899 Other long term (current) drug therapy: Secondary | ICD-10-CM | POA: Insufficient documentation

## 2017-05-18 DIAGNOSIS — R0602 Shortness of breath: Secondary | ICD-10-CM | POA: Diagnosis present

## 2017-05-18 DIAGNOSIS — I10 Essential (primary) hypertension: Secondary | ICD-10-CM | POA: Diagnosis not present

## 2017-05-18 DIAGNOSIS — J4521 Mild intermittent asthma with (acute) exacerbation: Secondary | ICD-10-CM | POA: Insufficient documentation

## 2017-05-18 DIAGNOSIS — Z87440 Personal history of urinary (tract) infections: Secondary | ICD-10-CM | POA: Diagnosis not present

## 2017-05-18 MED ORDER — IPRATROPIUM-ALBUTEROL 0.5-2.5 (3) MG/3ML IN SOLN
3.0000 mL | Freq: Once | RESPIRATORY_TRACT | Status: AC
  Administered 2017-05-18: 3 mL via RESPIRATORY_TRACT
  Filled 2017-05-18: qty 3

## 2017-05-18 MED ORDER — PREDNISONE 20 MG PO TABS: 40.0000 mg | ORAL_TABLET | Freq: Every day | ORAL | 0 refills | Status: AC

## 2017-05-18 MED ORDER — PREDNISONE 20 MG PO TABS
20.0000 mg | ORAL_TABLET | Freq: Once | ORAL | Status: AC
  Administered 2017-05-18: 20 mg via ORAL
  Filled 2017-05-18: qty 1

## 2017-05-18 MED ORDER — ALBUTEROL SULFATE HFA 108 (90 BASE) MCG/ACT IN AERS: 2.0000 | INHALATION_SPRAY | RESPIRATORY_TRACT | 1 refills | Status: AC | PRN

## 2017-05-18 NOTE — MAU Note (Signed)
Pt presents with SOB, wheezing upon insertion and cough.   Symptoms began Monday.

## 2017-05-18 NOTE — Discharge Instructions (Signed)
No Primary Care Doctor:  To locate a primary care doctor that accepts your insurance or provides certain services:           [facility]: [phone]           Physician Referral Service: [phone] ask for My [place]  If no insurance, you need to see if you qualify for [facility] orange card, call to set      up appointment for eligibility/enrollment at [phone] or [phone]          Asthma, Adult Asthma is a recurring condition in which the airways tighten and narrow. Asthma can make it difficult to breathe. It can cause coughing, wheezing, and shortness of breath. Asthma episodes, also called asthma attacks, range from minor to life-threatening. Asthma cannot be cured, but medicines and lifestyle changes can help control it. What are the causes? Asthma is believed to be caused by inherited (genetic) and environmental factors, but its exact cause is unknown. Asthma may be triggered by allergens, lung infections, or irritants in the air. Asthma triggers are different for each person. Common triggers include:  Animal dander.  Dust mites.  Cockroaches.  Pollen from trees or grass.  Mold.  Smoke.  Air pollutants such as dust, household cleaners, hair sprays, aerosol sprays, paint fumes, strong chemicals, or strong odors.  Cold air, weather changes, and winds (which increase molds and pollens in the air).  Strong emotional expressions such as crying or laughing hard.  Stress.  Certain medicines (such as aspirin) or types of drugs (such as beta-blockers).  Sulfites in foods and drinks. Foods and drinks that may contain sulfites include dried fruit, potato chips, and sparkling grape juice.  Infections or inflammatory conditions such as the flu, a cold, or an inflammation of the nasal membranes (rhinitis).  Gastroesophageal reflux disease (GERD).  Exercise or strenuous activity.  What are the signs or symptoms? Symptoms may occur  immediately after asthma is triggered or many hours later. Symptoms include:  Wheezing.  Excessive nighttime or early morning coughing.  Frequent or severe coughing with a common cold.  Chest tightness.  Shortness of breath.  How is this diagnosed? The diagnosis of asthma is made by a review of your medical history and a physical exam. Tests may also be performed. These may include:  Lung function studies. These tests show how much air you breathe in and out.  Allergy tests.  Imaging tests such as X-rays.  How is this treated? Asthma cannot be cured, but it can usually be controlled. Treatment involves identifying and avoiding your asthma triggers. It also involves medicines. There are 2 classes of medicine used for asthma treatment:  Controller medicines. These prevent asthma symptoms from occurring. They are usually taken every day.  Reliever or rescue medicines. These quickly relieve asthma symptoms. They are used as needed and provide short-term relief.  Your health care provider will help you create an asthma action plan. An asthma action plan is a written plan for managing and treating your asthma attacks. It includes a list of your asthma triggers and how they may be avoided. It also includes information on when medicines should be taken and when their dosage should be changed. An action plan may also involve the use of a device called a peak flow meter. A peak flow meter measures how well the lungs are working. It helps you monitor your condition. Follow these instructions at home:  Take medicines only as directed by your health care provider.  Speak with your health care provider if you have questions about how or when to take the medicines.  Use a peak flow meter as directed by your health care provider. Record and keep track of readings.  Understand and use the action plan to help minimize or stop an asthma attack without needing to seek medical care.  Control your home  environment in the following ways to help prevent asthma attacks: ? Do not smoke. Avoid being exposed to secondhand smoke. ? Change your heating and air conditioning filter regularly. ? Limit your use of fireplaces and wood stoves. ? Get rid of pests (such as roaches and mice) and their droppings. ? Throw away plants if you see mold on them. ? Clean your floors and dust regularly. Use unscented cleaning products. ? Try to have someone else vacuum for you regularly. Stay out of rooms while they are being vacuumed and for a short while afterward. If you vacuum, use a dust mask from a hardware store, a double-layered or microfilter vacuum cleaner bag, or a vacuum cleaner with a HEPA filter. ? Replace carpet with wood, tile, or vinyl flooring. Carpet can trap dander and dust. ? Use allergy-proof pillows, mattress covers, and box spring covers. ? Wash bed sheets and blankets every week in hot water and dry them in a dryer. ? Use blankets that are made of polyester or cotton. ? Clean bathrooms and kitchens with bleach. If possible, have someone repaint the walls in these rooms with mold-resistant paint. Keep out of the rooms that are being cleaned and painted. ? Wash hands frequently. Contact a health care provider if:  You have wheezing, shortness of breath, or a cough even if taking medicine to prevent attacks.  The colored mucus you cough up (sputum) is thicker than usual.  Your sputum changes from clear or white to yellow, green, gray, or bloody.  You have any problems that may be related to the medicines you are taking (such as a rash, itching, swelling, or trouble breathing).  You are using a reliever medicine more than 2-3 times per week.  Your peak flow is still at 50-79% of your personal best after following your action plan for 1 hour.  You have a fever. Get help right away if:  You seem to be getting worse and are unresponsive to treatment during an asthma attack.  You are short  of breath even at rest.  You get short of breath when doing very little physical activity.  You have difficulty eating, drinking, or talking due to asthma symptoms.  You develop chest pain.  You develop a fast heartbeat.  You have a bluish color to your lips or fingernails.  You are light-headed, dizzy, or faint.  Your peak flow is less than 50% of your personal best. This information is not intended to replace advice given to you by your health care provider. Make sure you discuss any questions you have with your health care provider. Document Released: 02/27/2005 Document Revised: 08/11/2015 Document Reviewed: 09/26/2012 Elsevier Interactive Patient Education  2017 [employer].

## 2017-05-18 NOTE — MAU Provider Note (Signed)
History     CSN: [id number]  Arrival date and time: 05/18/17 1313   First Provider Initiated Contact with Patient 05/18/17 1331      Chief Complaint  Patient presents with  . Shortness of Breath   HPI Tonya Joseph is a 22 y.o. G14P1102 female who presents with wheezing. Hx of asthma. Does not have PCP. Normally uses her albuterol inhaler twice per day; no maintenance meds. Current symptoms began on Monday. States she had a cold last week which improved but then started having wheezing and SOB. Last used her inhaler yesterday; states her toddler daughter threw her inhaler out the car window this morning so she hasn't been able to use it today.  She is 1 month s/p SVD. Was scheduled for PP visit this morning but missed her appt due to work and forgot to reschedule it.   OB History    Gravida Para Term Preterm AB Living   2 2 1 1  0 2   SAB TAB Ectopic Multiple Live Births         0 2      Past Medical History:  Diagnosis Date  . Anxiety   . Asthma   . Hypertension   . Infection    UTI    Past Surgical History:  Procedure Laterality Date  . NO PAST SURGERIES      Family History  Problem Relation Age of Onset  . Diabetes Maternal Grandmother   . Hypertension Paternal Grandmother     Social History   Tobacco Use  . Smoking status: Former Games developer  . Smokeless tobacco: Never Used  . Tobacco comment: quit afteer asthma dx  Substance Use Topics  . Alcohol use: No    Frequency: Never  . Drug use: No    Allergies: No Known Allergies  Medications Prior to Admission  Medication Sig Dispense Refill Last Dose  . albuterol (PROVENTIL HFA;VENTOLIN HFA) 108 (90 Base) MCG/ACT inhaler Inhale 2 puffs every 6 (six) hours as needed into the lungs for wheezing or shortness of breath. 1 Inhaler 2 Taking  . ibuprofen (ADVIL,MOTRIN) 600 MG tablet Take 1 tablet (600 mg total) by mouth every 6 (six) hours. 30 tablet 0   . Prenatal Vit-Fe Fumarate-FA (PRENATAL MULTIVITAMIN) TABS  tablet Take 1 tablet daily at 12 noon by mouth.   04/13/2017 at Unknown time  . senna-docusate (SENOKOT-S) 8.6-50 MG tablet Take 2 tablets by mouth daily. 60 tablet 0     Review of Systems  Constitutional: Negative for chills and fever.  Respiratory: Positive for cough, chest tightness, shortness of breath and wheezing.   Cardiovascular: Negative.   Gastrointestinal: Negative.   Neurological: Negative.    Physical Exam   Blood pressure 137/86, pulse 71, temperature 98.1 F (36.7 C), temperature source Oral, resp. rate 20, weight 199 lb 14.4 oz (90.7 kg), last menstrual period 05/24/2016, SpO2 95 %, unknown if currently breastfeeding.  Physical Exam  Nursing note and vitals reviewed. Constitutional: She is oriented to person, place, and time. She appears well-developed and well-nourished. No distress.  HENT:  Head: Normocephalic and atraumatic.  Eyes: Conjunctivae are normal. Right eye exhibits no discharge. Left eye exhibits no discharge. No scleral icterus.  Neck: Normal range of motion.  Cardiovascular: Normal rate, regular rhythm and normal heart sounds.  No murmur heard. Respiratory: Effort normal. No accessory muscle usage. No respiratory distress. She has wheezes (throughout).  Neurological: She is alert and oriented to person, place, and time.  Skin: Skin is warm  and dry. She is not diaphoretic.  Psychiatric: She has a normal mood and affect. Her behavior is normal. Judgment and thought content normal.    MAU Course  Procedures No results found for this or any previous visit (from the past 24 hour(s)).  MDM VSS, SpO2 >95% Duoneb tx given --- pt reports improvement in symptoms. Continues to have some wheezing in BUF but improved. Dose of oral steroids given prior to discharge.   Assessment and Plan  A: 1. Mild intermittent asthma with exacerbation    P: Discharge home Rx albuterol inhaler & prednisone Discussed reasons to return to MAU vs ED Reschedule PP  visit Info given to establish primary care --- stressed importance of proper management of her asthma   Judeth Hornrin Montavius Subramaniam 05/18/2017, 1:32 PM

## 2019-10-05 IMAGING — US US MFM OB DETAIL+14 WK
1 series · 14 of 28 positions shown · non-contrast
Comparison: none

[Series 1: us mfm ob detail+14 wk · 56 acquisitions, 14 frames shown]
[im 3/56]
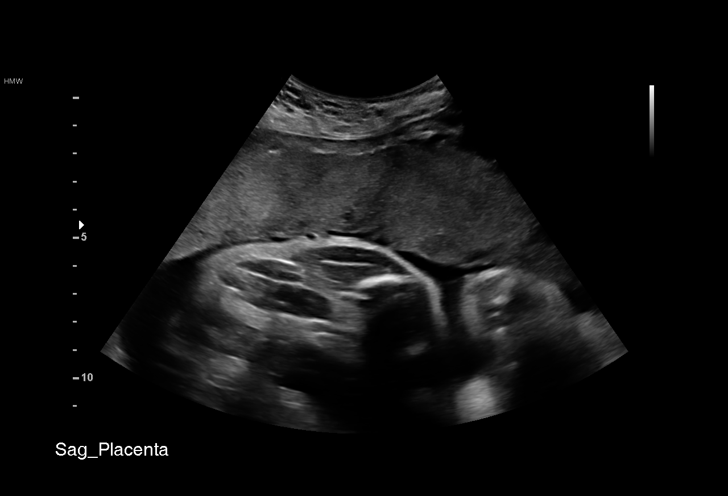
[im 7/56]
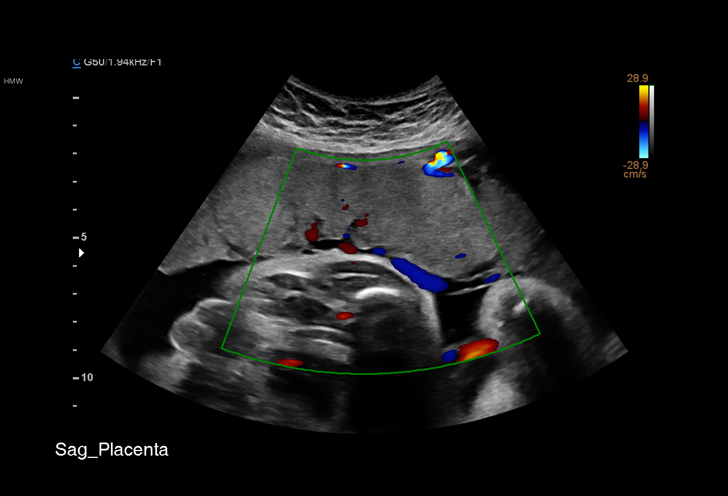
[im 11/56]
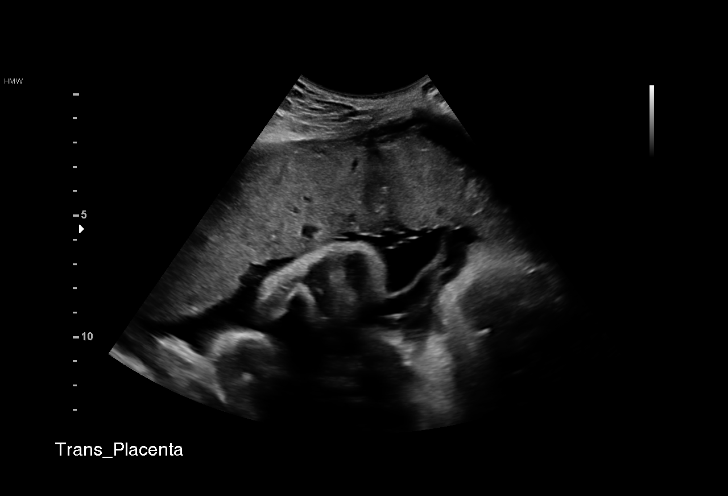
[im 15/56]
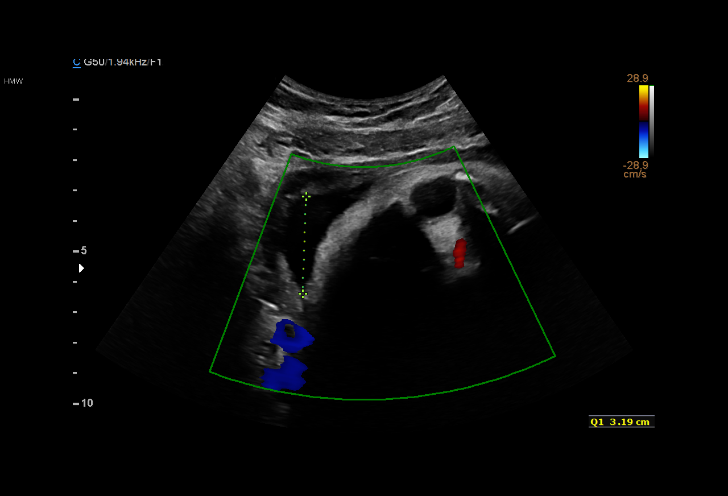
[im 19/56]
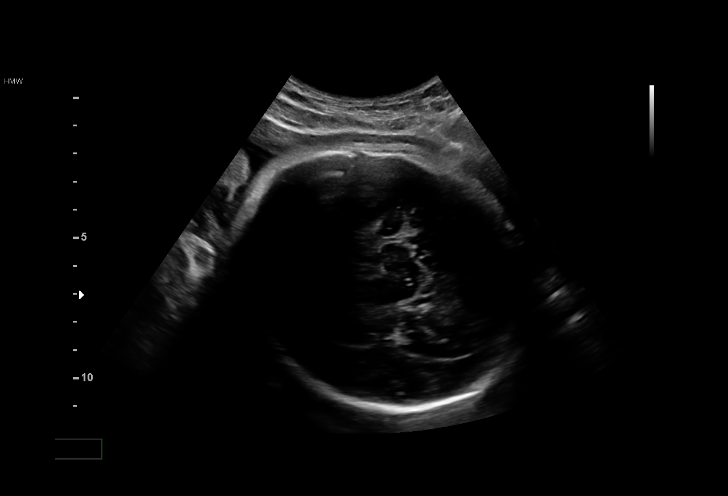
[im 23/56]
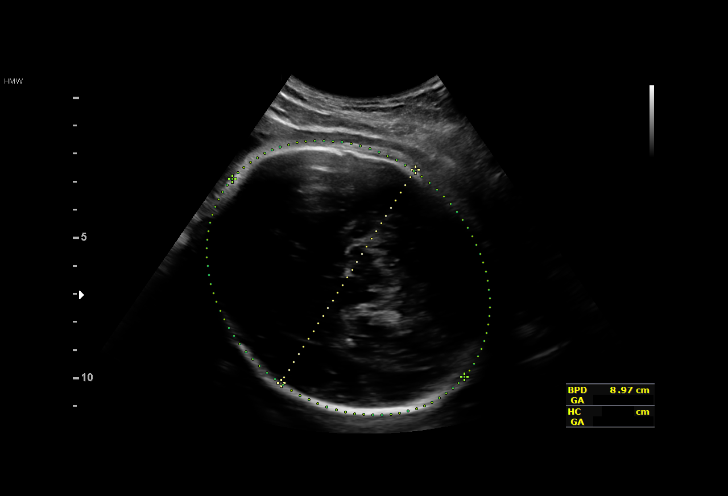
[im 27/56]
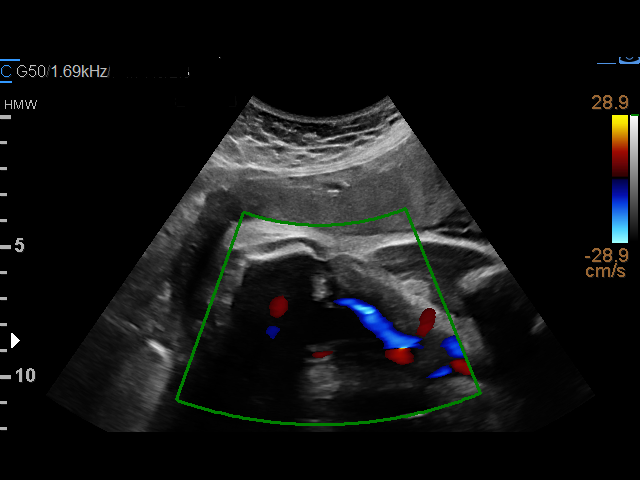
[im 31/56]
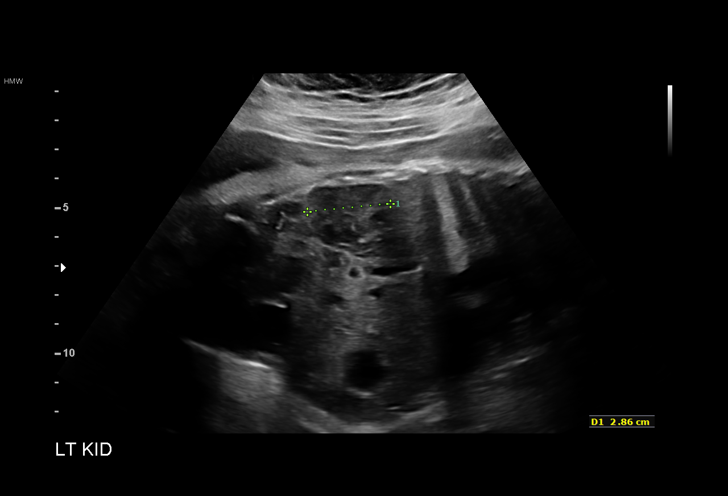
[im 35/56]
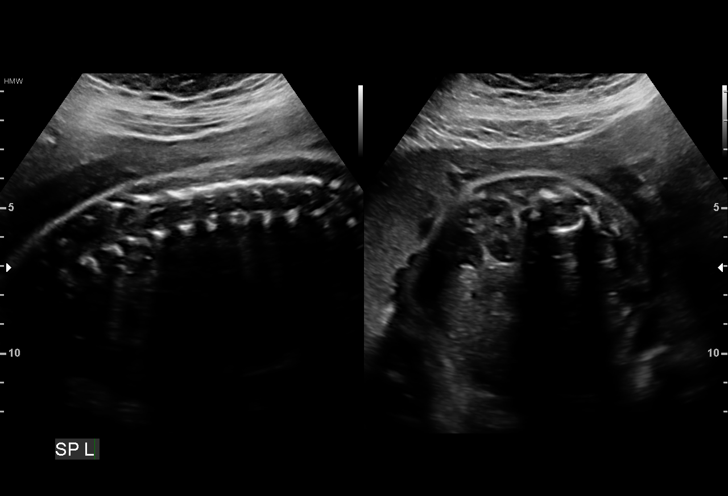
[im 39/56]
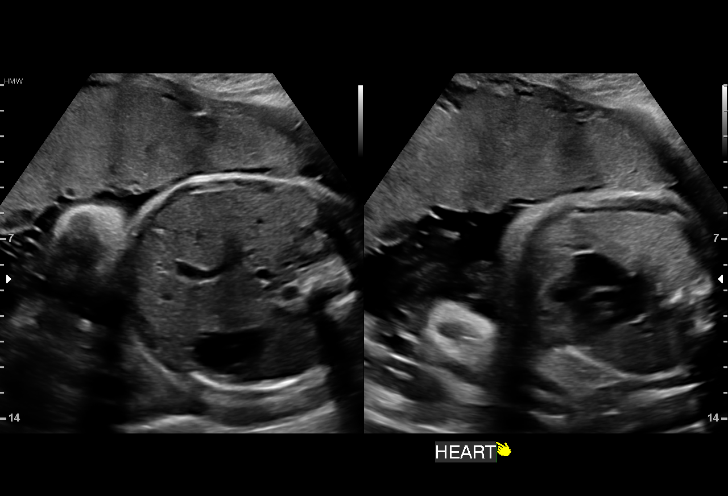
[im 43/56]
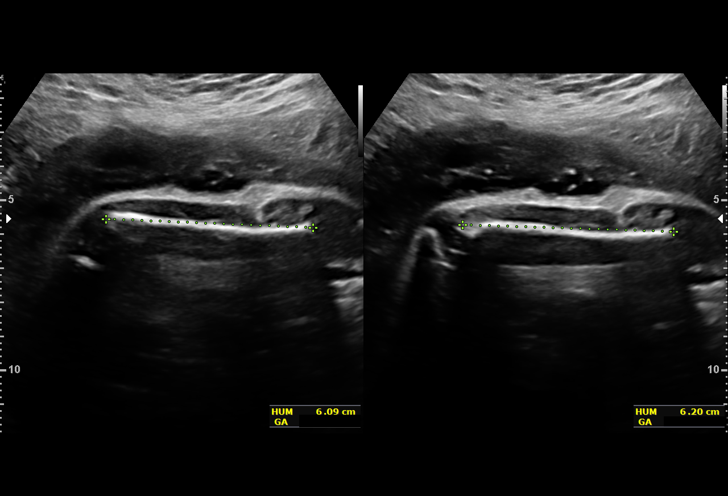
[im 47/56]
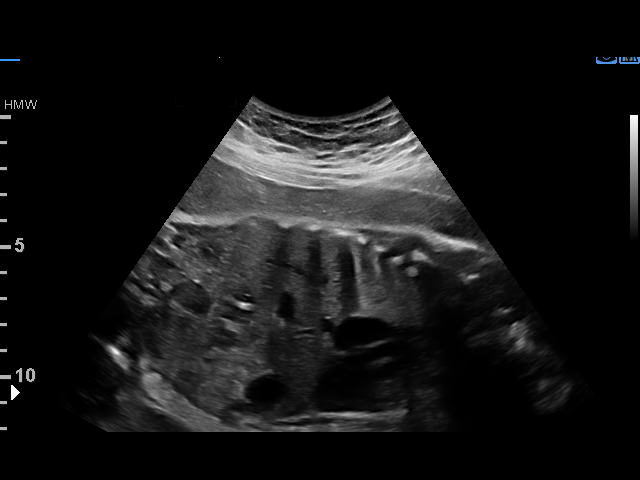
[im 51/56]
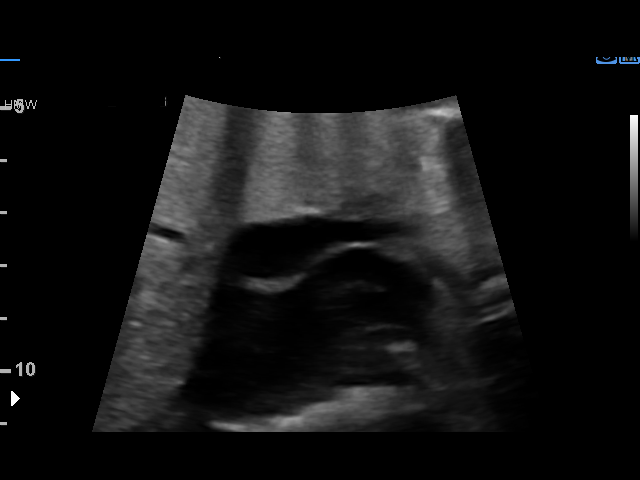
[im 56/56]
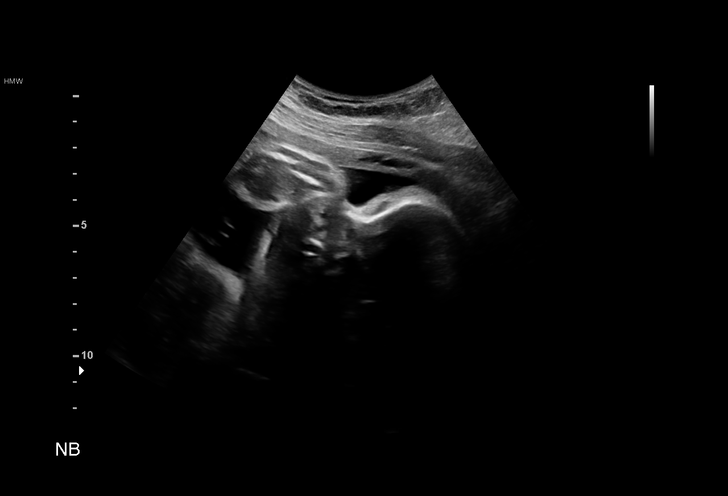

[14 of 28 positions shown; findings below may reference images not displayed]

OB/Gyn Clinic

1  NIZTHAR EX           [id number]      [id number]     [id number]
Indications

35 weeks gestation of pregnancy
Insufficient Prenatal Care
Encounter for other specified antenatal
screening
OB History

Gravidity:    2         Term:   0        Prem:   1        SAB:   0
TOP:          0       Ectopic:  0        Living: 1
Fetal Evaluation

Num Of Fetuses:     1
Fetal Heart         143
Rate(bpm):
Cardiac Activity:   Observed
Presentation:       Cephalic
Placenta:           Anterior, above cervical os
P. Cord Insertion:  Visualized, central

Amniotic Fluid
AFI FV:      Subjectively within normal limits

AFI Sum(cm)     %Tile       Largest Pocket(cm)
10.06           21

RUQ(cm)       RLQ(cm)       LUQ(cm)        LLQ(cm)
3.19          0
Biometry

BPD:      88.5  mm     G. Age:  35w 6d         68  %    CI:        79.69   %    70 - 86
FL/HC:      21.3   %    20.1 -
HC:      313.3  mm     G. Age:  35w 1d         16  %    HC/AC:      1.01        0.93 -
AC:      310.3  mm     G. Age:  35w 0d         49  %    FL/BPD:     75.3   %    71 - 87
FL:       66.6  mm     G. Age:  34w 2d         20  %    FL/AC:      21.5   %    20 - 24
HUM:      61.4  mm     G. Age:  35w 4d         71  %

Est. FW:    5172  gm    5 lb 10 oz      54  %
Gestational Age

U/S Today:     35w 1d                                        EDD:   04/12/17
Best:          35w 2d     Det. By:  Previous Ultrasound      EDD:   04/11/17
(12/07/16)
Anatomy

Cranium:               Appears normal         Aortic Arch:            Not well visualized
Cavum:                 Not well visualized    Ductal Arch:            Not well visualized
Ventricles:            Appears normal         Diaphragm:              Appears normal
Choroid Plexus:        Not well visualized    Stomach:                Appears normal, left
sided
Cerebellum:            Not well visualized    Abdomen:                Appears normal
Posterior Fossa:       Not well visualized    Abdominal Wall:         Not well visualized
Nuchal Fold:           Not applicable (>20    Cord Vessels:           Appears normal (3
wks GA)                                        vessel cord)
Face:                  Not well visualized    Kidneys:                Appear normal
Lips:                  Appears normal         Bladder:                Appears normal
Thoracic:              Appears normal         Spine:                  Appears normal
Heart:                 Not well visualized    Upper Extremities:      Visualized
RVOT:                  Appears normal         Lower Extremities:      Visualized
LVOT:                  Appears normal

Other:  Nasal bone visualized. Technically difficult due to advanced GA and
fetal position. Complete fetal anatomic survey previously performed.
Cervix Uterus Adnexa

Cervix
Not visualized (advanced GA >21wks)

Uterus
No abnormality visualized.

Left Ovary
No adnexal mass visualized.

Right Ovary
No adnexal mass visualized.

Cul De Sac:   No free fluid seen.

Adnexa:       No abnormality visualized.
Impression

SIUP at 26w9d, late to prenatal care
active fetus
cephalic presentation
EFW 54th%'le
no defects demonstrated
limited views as above
no previa
AFI is normal
Recommendations

Follow up ultrasound to complete survey in 4-5 weeks if not
delivered.

## 2019-11-02 IMAGING — US US MFM OB FOLLOW-UP
1 series · 14 of 28 positions shown · non-contrast
Comparison: none

[Series 1: us mfm ob follow-up · 14 of 42 slices shown]
[im 2/42]
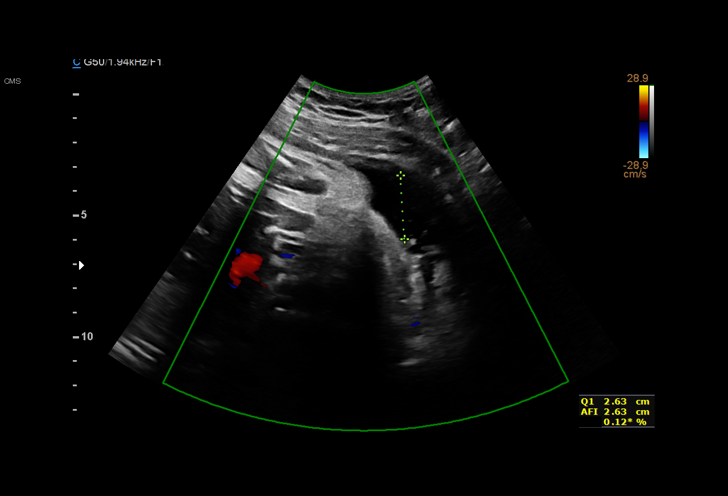
[im 5/42]
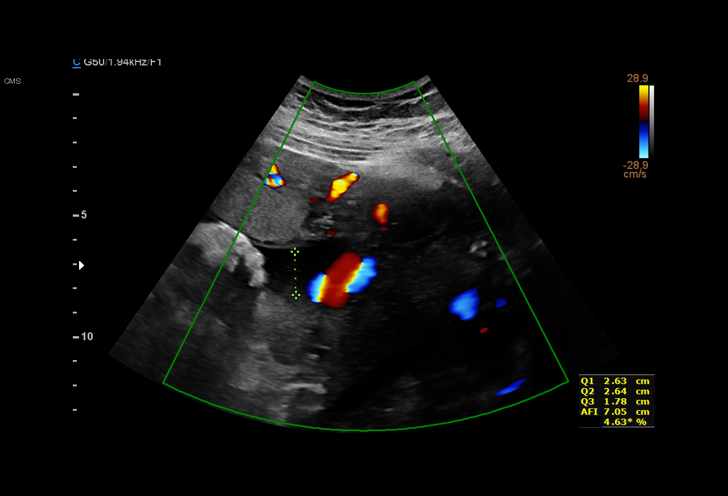
[im 8/42]
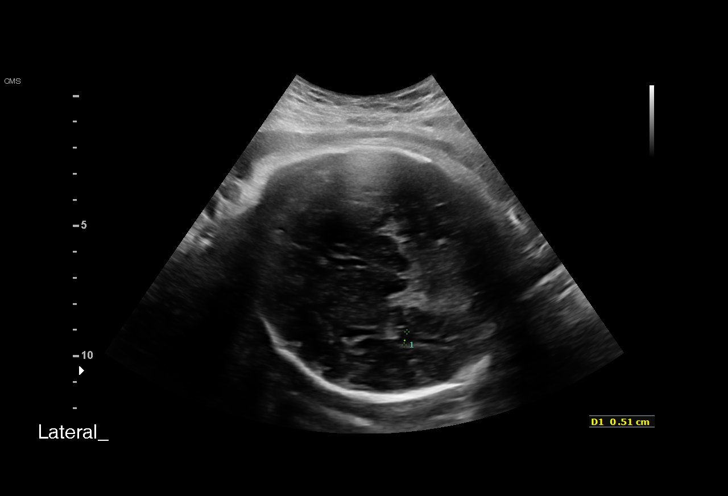
[im 11/42]
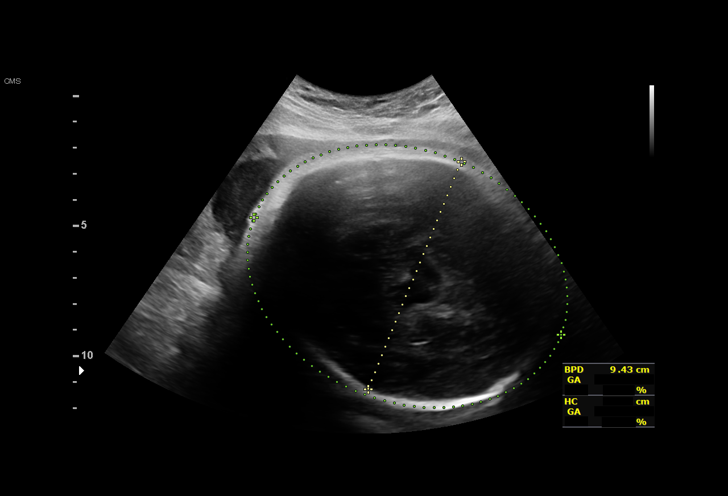
[im 14/42]
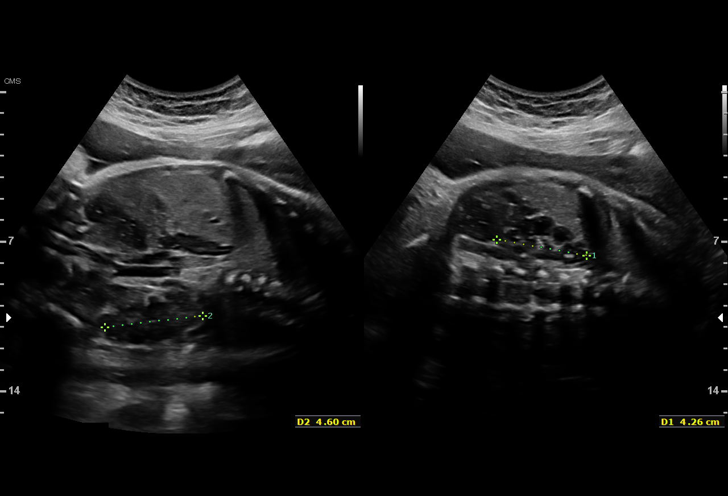
[im 17/42]
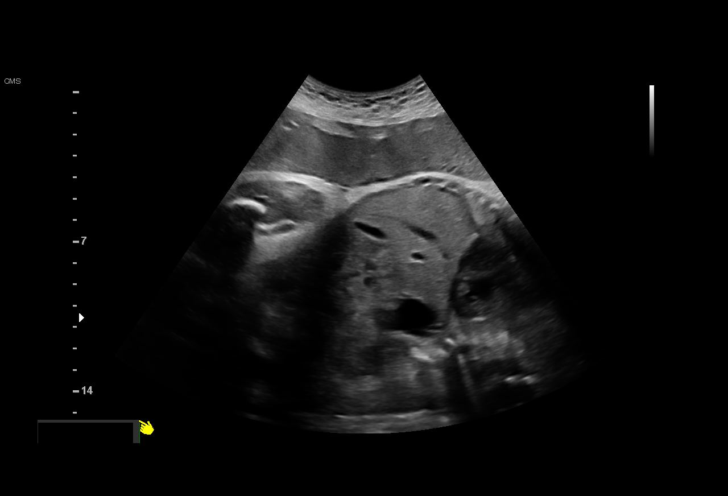
[im 20/42]
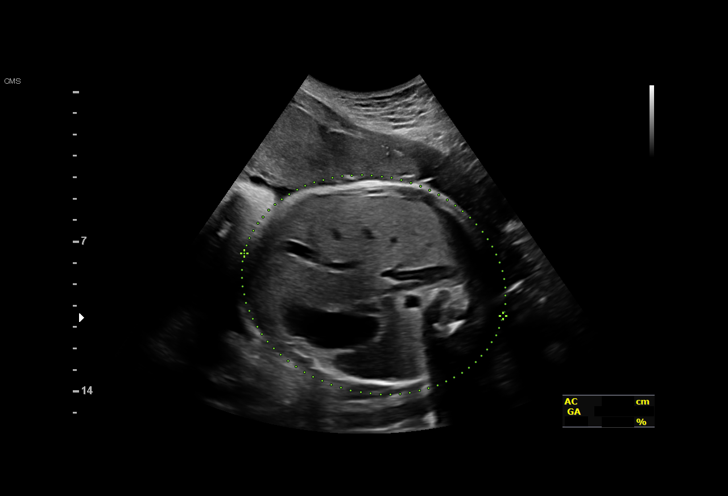
[im 23/42]
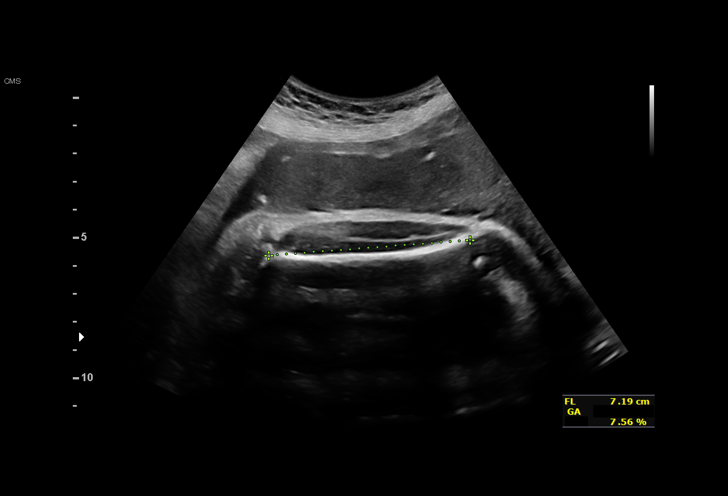
[im 26/42]
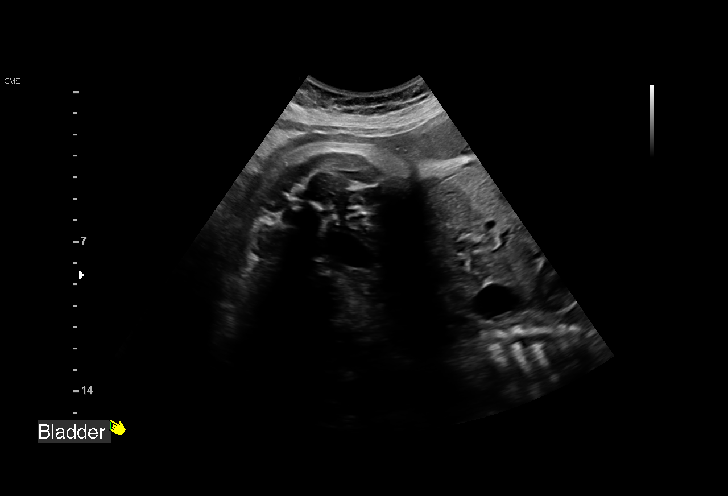
[im 29/42]
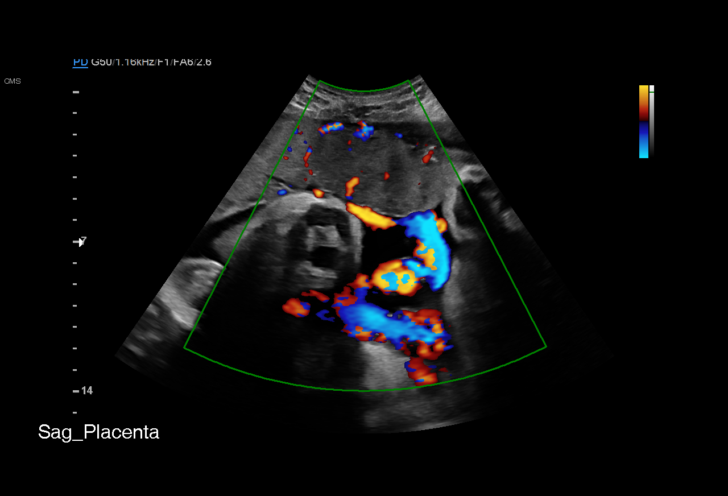
[im 32/42]
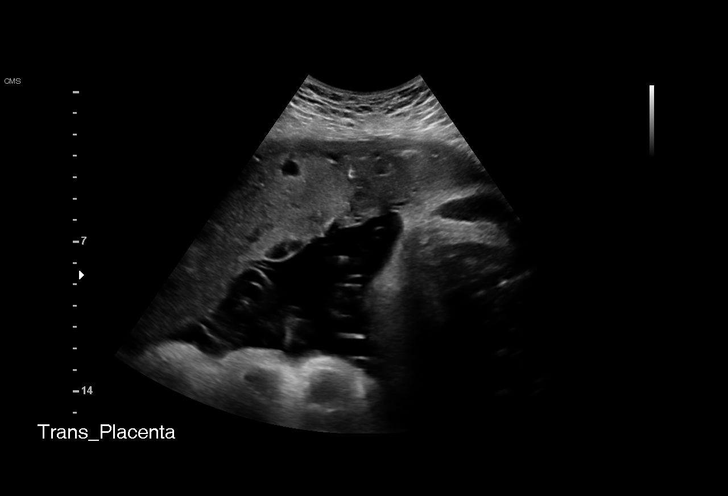
[im 35/42]
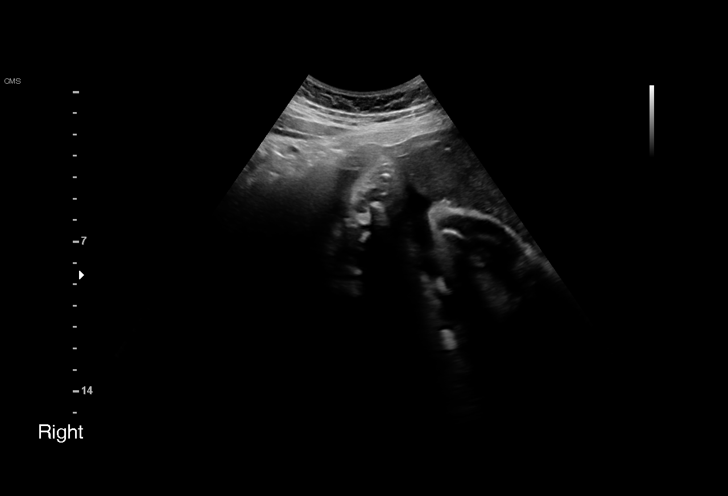
[im 38/42]
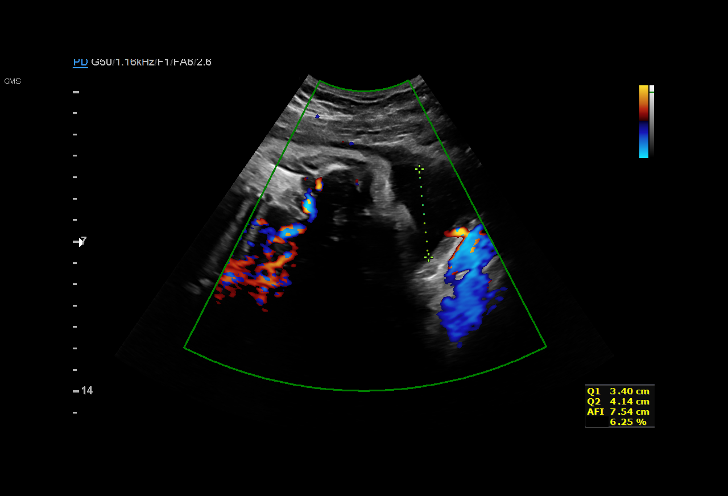
[im 42/42]
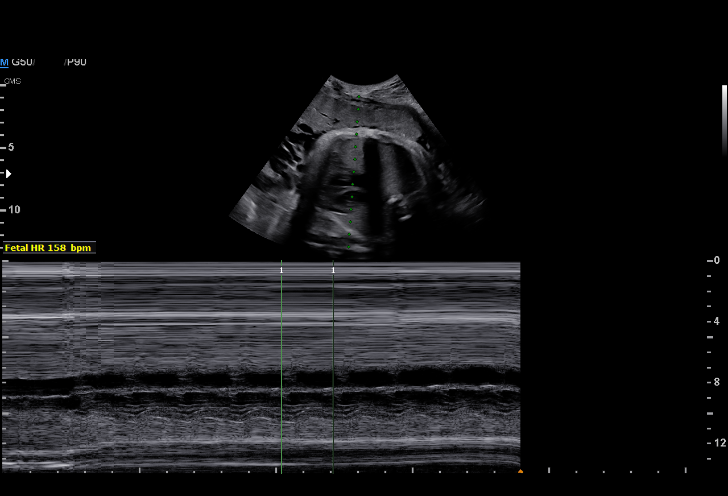

[14 of 28 positions shown; findings below may reference images not displayed]

OB/Gyn Clinic

Indications

39 weeks gestation of pregnancy
Insufficient Prenatal Care
Encounter for other antenatal screening
follow-up
OB History

Gravidity:    2         Term:   0        Prem:   1        SAB:   0
TOP:          0       Ectopic:  0        Living: 1
Fetal Evaluation

Num Of Fetuses:     1
Fetal Heart         158
Rate(bpm):
Cardiac Activity:   Observed
Presentation:       Cephalic
Placenta:           Anterior, above cervical os
P. Cord Insertion:  Visualized

Amniotic Fluid
AFI FV:      Subjectively within normal limits

AFI Sum(cm)     %Tile       Largest Pocket(cm)
8.37            15

RUQ(cm)                     LUQ(cm)        LLQ(cm)
3.01
Biometry

BPD:      94.5  mm     G. Age:  38w 4d         68  %    CI:         73.8   %    70 - 86
FL/HC:      20.3   %    20.6 -
HC:      349.4  mm     G. Age:  40w 4d         74  %    HC/AC:      0.98        0.87 -
AC:      357.1  mm     G. Age:  39w 4d         82  %    FL/BPD:     74.9   %    71 - 87
FL:       70.8  mm     G. Age:  36w 2d          5  %    FL/AC:      19.8   %    20 - 24
HUM:      58.6  mm     G. Age:  33w 6d        < 5  %

Est. FW:    5502  gm           8 lb     82  %
Gestational Age

U/S Today:     38w 5d                                        EDD:   04/15/17
Best:          39w 1d     Det. By:  Early Ultrasound         EDD:   04/12/17
(10/24/16)
Anatomy

Cranium:               Appears normal         Aortic Arch:            Not well visualized
Cavum:                 Not well visualized    Ductal Arch:            Not well visualized
Ventricles:            Appears normal         Diaphragm:              Appears normal
Choroid Plexus:        Not well visualized    Stomach:                Appears normal, left
sided
Cerebellum:            Not well visualized    Abdomen:                Appears normal
Posterior Fossa:       Not well visualized    Abdominal Wall:         Not well visualized
Nuchal Fold:           Not applicable (>20    Cord Vessels:           Previously seen
wks GA)
Face:                  Not well visualized    Kidneys:                Appear normal
Lips:                  Previously seen        Bladder:                Appears normal
Thoracic:              Appears normal         Spine:                  Previously seen
Heart:                 Not well visualized    Upper Extremities:      Previously seen
RVOT:                  Previously seen        Lower Extremities:      Previously seen
LVOT:                  Previously seen

Other:  Nasal bone visualized. Technically difficult due to advanced GA and
fetal position.
Cervix Uterus Adnexa

Cervix
Not visualized (advanced GA >89wks)

Uterus
No abnormality visualized.

Left Ovary
Not visualized.

Right Ovary
Not visualized.

Cul De Sac:   No free fluid seen.

Adnexa:       No abnormality visualized.
Impression

SIUP at 39+1 weeks
Cephalic presentation
Normal interval anatomy; limited views of intracranial
structures, face, heart and CI
Low normal amniotic fluid volume
Appropriate interval growth with EFW at the 81st %tile
Recommendations

Follow-up ultrasounds as clinically indicated.
